# Patient Record
Sex: Female | Born: 1955 | Race: White | Hispanic: No | Marital: Married | Smoking: Never smoker
Health system: Southern US, Community
[De-identification: ages and names within clinical notes are randomized; demographics above are authoritative.]

## PROBLEM LIST (undated history)

## (undated) DIAGNOSIS — O24419 Gestational diabetes mellitus in pregnancy, unspecified control: Secondary | ICD-10-CM

## (undated) HISTORY — PX: LAPAROSCOPY: SHX197

## (undated) HISTORY — DX: Gestational diabetes mellitus in pregnancy, unspecified control: O24.419

---

## 2002-09-30 ENCOUNTER — Other Ambulatory Visit: Admission: RE | Admit: 2002-09-30 | Discharge: 2002-09-30 | Payer: Self-pay | Admitting: Family Medicine

## 2003-11-18 ENCOUNTER — Encounter: Payer: Self-pay | Admitting: Family Medicine

## 2003-11-18 ENCOUNTER — Other Ambulatory Visit: Admission: RE | Admit: 2003-11-18 | Discharge: 2003-11-18 | Payer: Self-pay | Admitting: Family Medicine

## 2003-11-18 LAB — CONVERTED CEMR LAB: Pap Smear: NORMAL

## 2006-12-15 ENCOUNTER — Encounter: Payer: Self-pay | Admitting: Family Medicine

## 2006-12-15 DIAGNOSIS — O99810 Abnormal glucose complicating pregnancy: Secondary | ICD-10-CM | POA: Insufficient documentation

## 2006-12-26 ENCOUNTER — Ambulatory Visit: Payer: Self-pay | Admitting: Family Medicine

## 2006-12-26 ENCOUNTER — Encounter: Payer: Self-pay | Admitting: Family Medicine

## 2006-12-26 ENCOUNTER — Other Ambulatory Visit: Admission: RE | Admit: 2006-12-26 | Discharge: 2006-12-26 | Payer: Self-pay | Admitting: Family Medicine

## 2006-12-29 LAB — CONVERTED CEMR LAB
ALT: 79 units/L — ABNORMAL HIGH (ref 0–35)
AST: 56 units/L — ABNORMAL HIGH (ref 0–37)
Albumin: 4.3 g/dL (ref 3.5–5.2)
Alkaline Phosphatase: 110 units/L (ref 39–117)
BUN: 13 mg/dL (ref 6–23)
Basophils Absolute: 0.1 10*3/uL (ref 0.0–0.1)
Basophils Relative: 1.6 % — ABNORMAL HIGH (ref 0.0–1.0)
CO2: 31 meq/L (ref 19–32)
Calcium: 9.5 mg/dL (ref 8.4–10.5)
Chloride: 105 meq/L (ref 96–112)
Creatinine, Ser: 0.6 mg/dL (ref 0.4–1.2)
HDL: 58.7 mg/dL (ref >39.0)
Hemoglobin: 13.9 g/dL (ref 12.0–15.0)
LDL Cholesterol: 117 mg/dL — ABNORMAL HIGH (ref 0–99)
MCHC: 35 g/dL (ref 30.0–36.0)
Monocytes Absolute: 0.4 10*3/uL (ref 0.2–0.7)
Monocytes Relative: 10.4 % (ref 3.0–11.0)
RBC: 4.25 M/uL (ref 3.87–5.11)
RDW: 12 % (ref 11.5–14.6)
Total Bilirubin: 1.1 mg/dL (ref 0.3–1.2)
Total CHOL/HDL Ratio: 3.2
Total Protein: 7.3 g/dL (ref 6.0–8.3)
VLDL: 10 mg/dL (ref 0–40)

## 2006-12-30 ENCOUNTER — Encounter (INDEPENDENT_AMBULATORY_CARE_PROVIDER_SITE_OTHER): Payer: Self-pay | Admitting: *Deleted

## 2006-12-30 LAB — CONVERTED CEMR LAB: Pap Smear: NORMAL

## 2006-12-31 ENCOUNTER — Encounter (INDEPENDENT_AMBULATORY_CARE_PROVIDER_SITE_OTHER): Payer: Self-pay | Admitting: *Deleted

## 2007-01-14 ENCOUNTER — Encounter: Payer: Self-pay | Admitting: Family Medicine

## 2007-01-19 ENCOUNTER — Encounter (INDEPENDENT_AMBULATORY_CARE_PROVIDER_SITE_OTHER): Payer: Self-pay | Admitting: *Deleted

## 2007-01-29 ENCOUNTER — Ambulatory Visit: Payer: Self-pay | Admitting: Family Medicine

## 2007-01-29 DIAGNOSIS — R740 Nonspecific elevation of levels of transaminase and lactic acid dehydrogenase [LDH]: Secondary | ICD-10-CM

## 2007-01-29 LAB — CONVERTED CEMR LAB
ALT: 42 units/L — ABNORMAL HIGH (ref 0–35)
AST: 35 units/L (ref 0–37)
Bilirubin, Direct: 0.1 mg/dL (ref 0.0–0.3)
Total Bilirubin: 0.9 mg/dL (ref 0.3–1.2)

## 2007-02-02 LAB — CONVERTED CEMR LAB
HCV Ab: NEGATIVE
Hepatitis B Surface Ag: NEGATIVE

## 2007-04-14 ENCOUNTER — Ambulatory Visit: Payer: Self-pay | Admitting: Family Medicine

## 2012-01-10 ENCOUNTER — Encounter: Payer: Self-pay | Admitting: Family Medicine

## 2012-01-10 ENCOUNTER — Other Ambulatory Visit (HOSPITAL_COMMUNITY)
Admission: RE | Admit: 2012-01-10 | Discharge: 2012-01-10 | Disposition: A | Discharge status: Home / Self Care | Payer: 59 | Source: Ambulatory Visit | Attending: Family Medicine | Admitting: Family Medicine

## 2012-01-10 ENCOUNTER — Ambulatory Visit (INDEPENDENT_AMBULATORY_CARE_PROVIDER_SITE_OTHER): Payer: 59 | Admitting: Family Medicine

## 2012-01-10 VITALS — BP 136/77 | HR 72 | Temp 98.5°F | Ht 66.0 in | Wt 122.8 lb

## 2012-01-10 DIAGNOSIS — Z01419 Encounter for gynecological examination (general) (routine) without abnormal findings: Secondary | ICD-10-CM | POA: Insufficient documentation

## 2012-01-10 DIAGNOSIS — Z1151 Encounter for screening for human papillomavirus (HPV): Secondary | ICD-10-CM | POA: Insufficient documentation

## 2012-01-10 DIAGNOSIS — Z1211 Encounter for screening for malignant neoplasm of colon: Secondary | ICD-10-CM | POA: Insufficient documentation

## 2012-01-10 DIAGNOSIS — Z23 Encounter for immunization: Secondary | ICD-10-CM

## 2012-01-10 DIAGNOSIS — Z1231 Encounter for screening mammogram for malignant neoplasm of breast: Secondary | ICD-10-CM

## 2012-01-10 DIAGNOSIS — Z0000 Encounter for general adult medical examination without abnormal findings: Secondary | ICD-10-CM | POA: Insufficient documentation

## 2012-01-10 NOTE — Assessment & Plan Note (Signed)
Not interested in colonosc yet Will give IFOB

## 2012-01-10 NOTE — Assessment & Plan Note (Signed)
Scheduled annual screening mammogram Nl breast exam today  Encouraged monthly self exams   

## 2012-01-10 NOTE — Patient Instructions (Addendum)
Labs today to send to [facility] Pap done  We will schedule mammogram at check out  Please do stool card for colon cancer screening  Make sure to get a flu shot in the fall

## 2012-01-10 NOTE — Progress Notes (Signed)
Subjective:    Patient ID: Annette Hess, female    DOB: 03-23-1956, 56 y.o.   MRN: [id number]  HPI  Here for health maintenance exam and to review chronic medical problems    Has been very busy   Wt is down 15 lb with bmi of 19 Was intentional - does not want to loose any more  Really happy with results  Stopped eating fried foods and late at night  Trying to get husband to eat better   Pap/ gyn care Has been over 3 years  Still has all her parts  Never had abn paps  Got through menopause ok   mammo- has been a long time  Self exam-- no lumps or changes   Colon cancer screen Never had colonoscopy  Would like to hold off until next year Will do ifob card   Tdap -needs update  Flu shot-got one last fall   Walks for exercise   Labs - will need at [facility]   Patient Active Problem List  Diagnosis  . GESTATIONAL DIABETES  . ELEVATION, TRANSAMINASE/LDH LEVELS  . Routine general medical examination at a health care facility   Past Medical History  Diagnosis Date  . Gestational diabetes    Past Surgical History  Procedure Date  . Laparoscopy     infertility   History  Substance Use Topics  . Smoking status: Never Smoker   . Smokeless tobacco: Not on file  . Alcohol Use: No   Family History  Problem Relation Age of Onset  . Heart defect Father     ablation  . Cancer Mother     liver (deceased)  . Diabetes Mother   . Cancer Maternal Grandmother     uterine  . Cancer Paternal Grandmother     breast   . Anorexia nervosa Daughter    No Known Allergies No current outpatient prescriptions on file prior to visit.      Review of Systems Review of Systems  Constitutional: Negative for fever, appetite change, fatigue and unexpected weight change.  Eyes: Negative for pain and visual disturbance.  Respiratory: Negative for cough and shortness of breath.   Cardiovascular: Negative for cp or palpitations    Gastrointestinal: Negative for nausea, diarrhea  and constipation.  Genitourinary: Negative for urgency and frequency.  Skin: Negative for pallor or rash   Neurological: Negative for weakness, light-headedness, numbness and headaches.  Hematological: Negative for adenopathy. Does not bruise/bleed easily.  Psychiatric/Behavioral: Negative for dysphoric mood. The patient is not nervous/anxious.         Objective:   Physical Exam  Constitutional: She appears well-developed and well-nourished. No distress.  HENT:  Head: Normocephalic and atraumatic.  Right Ear: External ear normal.  Left Ear: External ear normal.  Nose: Nose normal.  Mouth/Throat: Oropharynx is clear and moist.  Eyes: Conjunctivae and EOM are normal. Pupils are equal, round, and reactive to light. No scleral icterus.  Neck: Normal range of motion. Neck supple. No JVD present. Carotid bruit is not present. No thyromegaly present.  Cardiovascular: Normal rate, regular rhythm, normal heart sounds and intact distal pulses.  Exam reveals no gallop.   Pulmonary/Chest: Effort normal and breath sounds normal. No respiratory distress. She has no wheezes.  Abdominal: Soft. Bowel sounds are normal. She exhibits no distension, no abdominal bruit and no mass. There is no tenderness.  Genitourinary: Vagina normal and uterus normal. No breast swelling, tenderness, discharge or bleeding. There is no rash, tenderness or lesion on  the right labia. There is no rash, tenderness or lesion on the left labia. Uterus is not enlarged and not tender. Cervix exhibits no motion tenderness, no discharge and no friability. Right adnexum displays no mass, no tenderness and no fullness. Left adnexum displays no mass, no tenderness and no fullness. No bleeding around the vagina. No vaginal discharge found.       Breast exam: No mass, nodules, thickening, tenderness, bulging, retraction, inflamation, nipple discharge or skin changes noted.  No axillary or clavicular LA.  Chaperoned exam.    Musculoskeletal:  Normal range of motion. She exhibits no edema and no tenderness.  Lymphadenopathy:    She has no cervical adenopathy.  Neurological: She is alert. She has normal reflexes. No cranial nerve deficit. She exhibits normal muscle tone. Coordination normal.  Skin: Skin is warm and dry. No rash noted. No erythema. No pallor.  Psychiatric: She has a normal mood and affect.          Assessment & Plan:

## 2012-01-10 NOTE — Assessment & Plan Note (Signed)
Reviewed health habits including diet and exercise and skin cancer prevention Also reviewed health mt list, fam hx and immunizations   Lab today to send to [facility]

## 2012-01-10 NOTE — Assessment & Plan Note (Signed)
Annual exam Post menopausal Disc need for ca and D Pap done

## 2012-01-11 LAB — COMPREHENSIVE METABOLIC PANEL
ALT: 18 IU/L (ref 0–32)
AST: 22 IU/L (ref 0–40)
Alkaline Phosphatase: 102 IU/L (ref 42–107)
BUN/Creatinine Ratio: 14 (ref 9–23)
CO2: 18 mmol/L — ABNORMAL LOW (ref 19–28)
Chloride: 104 mmol/L (ref 97–108)
GFR calc Af Amer: 117 mL/min/{1.73_m2} (ref >59)
Potassium: 4 mmol/L (ref 3.5–5.2)
Total Bilirubin: 0.4 mg/dL (ref 0.0–1.2)

## 2012-01-11 LAB — CBC WITH DIFFERENTIAL/PLATELET
Basophils Absolute: 0.1 10*3/uL (ref 0.0–0.2)
Eosinophils Absolute: 0.2 10*3/uL (ref 0.0–0.4)
HCT: 42.8 % (ref 34.0–46.6)
Hemoglobin: 14.1 g/dL (ref 11.1–15.9)
Immature Granulocytes: 0 % (ref 0–2)
Lymphocytes Absolute: 1.1 10*3/uL (ref 0.7–4.5)
Lymphs: 30 % (ref 14–46)
MCH: 31.2 pg (ref 26.6–33.0)
MCHC: 32.9 g/dL (ref 31.5–35.7)
Neutrophils Absolute: 2 10*3/uL (ref 1.8–7.8)
RDW: 13.1 % (ref 12.3–15.4)

## 2012-01-11 LAB — LIPID PANEL: VLDL Cholesterol Cal: 16 mg/dL (ref 5–40)

## 2012-01-11 LAB — TSH: TSH: 0.952 u[IU]/mL (ref 0.450–4.500)

## 2012-02-13 ENCOUNTER — Ambulatory Visit: Payer: Self-pay | Admitting: Family Medicine

## 2012-02-17 ENCOUNTER — Encounter: Payer: Self-pay | Admitting: Family Medicine

## 2012-02-18 ENCOUNTER — Encounter: Payer: Self-pay | Admitting: *Deleted

## 2012-02-18 ENCOUNTER — Other Ambulatory Visit: Payer: 59

## 2012-02-18 DIAGNOSIS — Z1211 Encounter for screening for malignant neoplasm of colon: Secondary | ICD-10-CM

## 2012-02-18 LAB — FECAL OCCULT BLOOD, IMMUNOCHEMICAL: Fecal Occult Bld: NEGATIVE

## 2012-02-19 ENCOUNTER — Encounter: Payer: Self-pay | Admitting: *Deleted

## 2013-04-06 ENCOUNTER — Encounter: Payer: Self-pay | Admitting: Family Medicine

## 2013-04-06 ENCOUNTER — Ambulatory Visit (INDEPENDENT_AMBULATORY_CARE_PROVIDER_SITE_OTHER): Payer: 59 | Admitting: Family Medicine

## 2013-04-06 VITALS — BP 128/82 | HR 73 | Temp 97.6°F | Ht 66.0 in | Wt 134.0 lb

## 2013-04-06 DIAGNOSIS — R5381 Other malaise: Secondary | ICD-10-CM | POA: Insufficient documentation

## 2013-04-06 DIAGNOSIS — F430 Acute stress reaction: Secondary | ICD-10-CM | POA: Insufficient documentation

## 2013-04-06 NOTE — Patient Instructions (Signed)
We will do a counseling referral at check out  Do what you can to take care of yourself  Still investigate couples counseling  Labs today

## 2013-04-06 NOTE — Progress Notes (Signed)
Subjective:    Patient ID: Annette Hess, female    DOB: 11-07-1955, 57 y.o.   MRN: [id number]  HPI Here for fatigue  Much stress Is tearful  Overwhelmed with stressors and responsibilities   Works 2 full time jobs -"that cannot change"- for 5 years  Husband got laid off 6 years ago and just starting back to work  Has a child to anorexia - finally recovered and moved out -that is good One of her twins had to move back in after difficulty with spouse MIL is dying with liver cancer -now has dementia  FIL- cannot hear - difficult care situation   Husband is planning his retirement in 4 years  He takes care of the house - and trying to downsize  She will probably go see a counselor   Has a friend who is good support to her   When she is stressed - she wants to be left alone - she is an introvert   She wants to know that nothing is wrong physically   Patient Active Problem List   Diagnosis Date Noted  . Routine general medical examination at a health care facility 01/10/2012  . Routine gynecological examination 01/10/2012  . Colon cancer screening 01/10/2012  . Other screening mammogram 01/10/2012  . ELEVATION, TRANSAMINASE/LDH LEVELS 01/29/2007  . GESTATIONAL DIABETES 12/15/2006   Past Medical History  Diagnosis Date  . Gestational diabetes    Past Surgical History  Procedure Laterality Date  . Laparoscopy      infertility   History  Substance Use Topics  . Smoking status: Never Smoker   . Smokeless tobacco: Not on file  . Alcohol Use: No   Family History  Problem Relation Age of Onset  . Heart defect Father     ablation  . Cancer Mother     liver (deceased)  . Diabetes Mother   . Cancer Maternal Grandmother     uterine  . Cancer Paternal Grandmother     breast   . Anorexia nervosa Daughter    No Known Allergies No current outpatient prescriptions on file prior to visit.   No current facility-administered medications on file prior to visit.      Review of Systems     Objective:   Physical Exam  Constitutional: She appears well-developed and well-nourished. No distress.  HENT:  Head: Normocephalic and atraumatic.  Right Ear: External ear normal.  Left Ear: External ear normal.  Nose: Nose normal.  Mouth/Throat: Oropharynx is clear and moist.  Eyes: Conjunctivae and EOM are normal. Pupils are equal, round, and reactive to light. Right eye exhibits no discharge. Left eye exhibits no discharge. No scleral icterus.  Neck: Normal range of motion. Neck supple. No JVD present. No thyromegaly present.  Cardiovascular: Normal rate, regular rhythm, normal heart sounds and intact distal pulses.  Exam reveals no gallop.   Pulmonary/Chest: Effort normal and breath sounds normal. No respiratory distress. She has no wheezes. She has no rales.  Abdominal: Soft. Bowel sounds are normal. She exhibits no distension and no mass. There is no tenderness.  Musculoskeletal: She exhibits no edema and no tenderness.  Lymphadenopathy:    She has no cervical adenopathy.  Neurological: She is alert. She has normal reflexes. No cranial nerve deficit. She exhibits normal muscle tone. Coordination normal.  Skin: Skin is warm and dry. No rash noted. No erythema. No pallor.  Psychiatric: Her speech is normal and behavior is normal. Her mood appears anxious. Thought content is not  paranoid. Cognition and memory are normal. She exhibits a depressed mood. She expresses no homicidal and no suicidal ideation.  Tearful  Is able to discuss stressors          Assessment & Plan:

## 2013-04-06 NOTE — Progress Notes (Signed)
Pre-visit discussion using our clinic review tool. No additional management support is needed unless otherwise documented below in the visit note.  

## 2013-04-07 NOTE — Assessment & Plan Note (Signed)
Suspect from work schedule/ 2 jobs and stressors with poor sleep  However- lab today to r/o other organic cause

## 2013-04-07 NOTE — Assessment & Plan Note (Addendum)
Social stressors are severe - eliminating time for self care in any way  Also marital issues Disc need for change in schedule Reviewed stressors/ coping techniques/symptoms/ support sources/ tx options and side effects in detail today  >25 min spent with face to face with patient, >50% counseling and/or coordinating care Ref to counseling  Also plans on marital counseling

## 2013-04-08 LAB — TSH: TSH: 1.23 u[IU]/mL (ref 0.450–4.500)

## 2013-04-08 LAB — CBC WITH DIFFERENTIAL/PLATELET
Basophils Absolute: 0.1 10*3/uL (ref 0.0–0.2)
Eos: 6 %
HCT: 43.7 % (ref 34.0–46.6)
Hemoglobin: 14.8 g/dL (ref 11.1–15.9)
Immature Grans (Abs): 0 10*3/uL (ref 0.0–0.1)
Immature Granulocytes: 0 %
Lymphocytes Absolute: 1.2 10*3/uL (ref 0.7–3.1)
Lymphs: 32 %
MCHC: 33.9 g/dL (ref 31.5–35.7)
Monocytes: 8 %
Neutrophils Absolute: 1.9 10*3/uL (ref 1.4–7.0)
Neutrophils Relative %: 51 %
RBC: 4.67 x10E6/uL (ref 3.77–5.28)
WBC: 3.7 10*3/uL (ref 3.4–10.8)

## 2013-04-08 LAB — COMPREHENSIVE METABOLIC PANEL
ALT: 23 IU/L (ref 0–32)
AST: 22 IU/L (ref 0–40)
Albumin/Globulin Ratio: 3.3 — ABNORMAL HIGH (ref 1.1–2.5)
BUN: 10 mg/dL (ref 6–24)
CO2: 28 mmol/L (ref 18–29)
Calcium: 9.2 mg/dL (ref 8.7–10.2)
Chloride: 103 mmol/L (ref 97–108)
GFR calc Af Amer: 106 mL/min/{1.73_m2} (ref >59)
Glucose: 85 mg/dL (ref 65–99)
Potassium: 5.1 mmol/L (ref 3.5–5.2)
Sodium: 144 mmol/L (ref 134–144)
Total Bilirubin: 0.4 mg/dL (ref 0.0–1.2)
Total Protein: 6.8 g/dL (ref 6.0–8.5)

## 2013-04-08 LAB — VITAMIN B12: Vitamin B-12: 1057 pg/mL — ABNORMAL HIGH (ref 211–946)

## 2013-04-15 ENCOUNTER — Ambulatory Visit (INDEPENDENT_AMBULATORY_CARE_PROVIDER_SITE_OTHER): Payer: 59 | Admitting: Psychology

## 2013-04-15 DIAGNOSIS — F4322 Adjustment disorder with anxiety: Secondary | ICD-10-CM

## 2013-05-06 ENCOUNTER — Ambulatory Visit (INDEPENDENT_AMBULATORY_CARE_PROVIDER_SITE_OTHER): Payer: 59 | Admitting: Psychology

## 2013-05-06 DIAGNOSIS — F4322 Adjustment disorder with anxiety: Secondary | ICD-10-CM

## 2013-05-12 ENCOUNTER — Encounter: Payer: Self-pay | Admitting: Family Medicine

## 2013-05-13 ENCOUNTER — Ambulatory Visit (INDEPENDENT_AMBULATORY_CARE_PROVIDER_SITE_OTHER): Payer: 59 | Admitting: Psychology

## 2013-05-13 DIAGNOSIS — F4322 Adjustment disorder with anxiety: Secondary | ICD-10-CM

## 2013-06-03 ENCOUNTER — Ambulatory Visit (INDEPENDENT_AMBULATORY_CARE_PROVIDER_SITE_OTHER): Payer: 59 | Admitting: Psychology

## 2013-06-03 DIAGNOSIS — F4322 Adjustment disorder with anxiety: Secondary | ICD-10-CM

## 2013-07-01 ENCOUNTER — Ambulatory Visit (INDEPENDENT_AMBULATORY_CARE_PROVIDER_SITE_OTHER): Payer: 59 | Admitting: Psychology

## 2013-07-01 DIAGNOSIS — F4322 Adjustment disorder with anxiety: Secondary | ICD-10-CM

## 2014-03-15 ENCOUNTER — Ambulatory Visit: Payer: Self-pay | Admitting: Family Medicine

## 2014-03-16 ENCOUNTER — Encounter: Payer: Self-pay | Admitting: Family Medicine

## 2015-02-02 ENCOUNTER — Telehealth: Payer: Self-pay | Admitting: Family Medicine

## 2015-02-02 DIAGNOSIS — Z0000 Encounter for general adult medical examination without abnormal findings: Secondary | ICD-10-CM

## 2015-02-02 NOTE — Telephone Encounter (Signed)
-----   Message from Baldomero Lamy sent at 01/27/2015  2:15 PM EDT ----- Regarding: Cpx labs Fri 9/9 need orders. Thanks! :-) Please order  future cpx labs for pt's upcoming lab appt. Thanks Rodney Booze

## 2015-02-03 ENCOUNTER — Other Ambulatory Visit (INDEPENDENT_AMBULATORY_CARE_PROVIDER_SITE_OTHER): Payer: 59

## 2015-02-03 DIAGNOSIS — Z0000 Encounter for general adult medical examination without abnormal findings: Secondary | ICD-10-CM

## 2015-02-03 NOTE — Addendum Note (Signed)
Addended by: Alvina Chou on: 02/03/2015 08:21 AM   Modules accepted: Orders

## 2015-02-04 LAB — CBC WITH DIFFERENTIAL/PLATELET
Basophils Absolute: 0.1 10*3/uL (ref 0.0–0.2)
Basos: 1 %
EOS (ABSOLUTE): 0.3 10*3/uL (ref 0.0–0.4)
Eos: 5 %
Hematocrit: 43.6 % (ref 34.0–46.6)
Hemoglobin: 14.3 g/dL (ref 11.1–15.9)
IMMATURE GRANULOCYTES: 0 %
Immature Grans (Abs): 0 10*3/uL (ref 0.0–0.1)
LYMPHS ABS: 1.7 10*3/uL (ref 0.7–3.1)
Lymphs: 36 %
MCH: 30.7 pg (ref 26.6–33.0)
MCHC: 32.8 g/dL (ref 31.5–35.7)
MCV: 94 fL (ref 79–97)
MONOS ABS: 0.4 10*3/uL (ref 0.1–0.9)
Monocytes: 9 %
NEUTROS PCT: 49 %
Neutrophils Absolute: 2.4 10*3/uL (ref 1.4–7.0)
PLATELETS: 346 10*3/uL (ref 150–379)
RBC: 4.66 x10E6/uL (ref 3.77–5.28)
RDW: 13 % (ref 12.3–15.4)
WBC: 4.8 10*3/uL (ref 3.4–10.8)

## 2015-02-04 LAB — LIPID PANEL
CHOLESTEROL TOTAL: 208 mg/dL — AB (ref 100–199)
Chol/HDL Ratio: 2.8 ratio units (ref 0.0–4.4)
HDL: 73 mg/dL (ref >39)
LDL CALC: 118 mg/dL — AB (ref 0–99)
TRIGLYCERIDES: 87 mg/dL (ref 0–149)
VLDL CHOLESTEROL CAL: 17 mg/dL (ref 5–40)

## 2015-02-04 LAB — COMPREHENSIVE METABOLIC PANEL
A/G RATIO: 1.4 (ref 1.1–2.5)
ALK PHOS: 124 IU/L — AB (ref 39–117)
ALT: 19 IU/L (ref 0–32)
AST: 18 IU/L (ref 0–40)
Albumin: 4 g/dL (ref 3.5–5.5)
BILIRUBIN TOTAL: 0.2 mg/dL (ref 0.0–1.2)
BUN/Creatinine Ratio: 21 (ref 9–23)
BUN: 12 mg/dL (ref 6–24)
CO2: 25 mmol/L (ref 18–29)
Calcium: 9.2 mg/dL (ref 8.7–10.2)
Chloride: 103 mmol/L (ref 97–108)
Creatinine, Ser: 0.57 mg/dL (ref 0.57–1.00)
GFR calc Af Amer: 118 mL/min/{1.73_m2} (ref >59)
GFR calc non Af Amer: 103 mL/min/{1.73_m2} (ref >59)
GLOBULIN, TOTAL: 2.9 g/dL (ref 1.5–4.5)
Glucose: 87 mg/dL (ref 65–99)
POTASSIUM: 5.6 mmol/L — AB (ref 3.5–5.2)
SODIUM: 145 mmol/L — AB (ref 134–144)
Total Protein: 6.9 g/dL (ref 6.0–8.5)

## 2015-02-04 LAB — HEMOGLOBIN A1C
ESTIMATED AVERAGE GLUCOSE: 114 mg/dL
HEMOGLOBIN A1C: 5.6 % (ref 4.8–5.6)

## 2015-02-04 LAB — TSH: TSH: 2.37 u[IU]/mL (ref 0.450–4.500)

## 2015-02-08 ENCOUNTER — Encounter: Payer: Self-pay | Admitting: Family Medicine

## 2015-02-08 ENCOUNTER — Ambulatory Visit (INDEPENDENT_AMBULATORY_CARE_PROVIDER_SITE_OTHER): Payer: 59 | Admitting: Family Medicine

## 2015-02-08 VITALS — BP 126/84 | HR 79 | Temp 99.1°F | Ht 66.5 in | Wt 144.0 lb

## 2015-02-08 DIAGNOSIS — Z1211 Encounter for screening for malignant neoplasm of colon: Secondary | ICD-10-CM | POA: Diagnosis not present

## 2015-02-08 DIAGNOSIS — Z0000 Encounter for general adult medical examination without abnormal findings: Secondary | ICD-10-CM

## 2015-02-08 DIAGNOSIS — E875 Hyperkalemia: Secondary | ICD-10-CM | POA: Diagnosis not present

## 2015-02-08 DIAGNOSIS — R748 Abnormal levels of other serum enzymes: Secondary | ICD-10-CM | POA: Insufficient documentation

## 2015-02-08 NOTE — Progress Notes (Signed)
Pre visit review using our clinic review tool, if applicable. No additional management support is needed unless otherwise documented below in the visit note. 

## 2015-02-08 NOTE — Patient Instructions (Signed)
Call after the first of the year if you want to get started with a colonoscopy referral Take care of yourself Get your flu shot at work as planned Get your mammogram as planned  Lab today to re check potassium and alk phos

## 2015-02-08 NOTE — Progress Notes (Signed)
Subjective:    Patient ID: Annette Hess, female    DOB: October 27, 1955, 59 y.o.   MRN: [id number]  HPI Here for health maintenance exam and to review chronic medical problems    Has been feeling ok   Wt is up 10 lb with bmi of 22  HIV screen- not high risk , declines  Hep C screen done and neg   Colon cancer screen-not ready for colonoscopy yet - would like to call after the first of the year    Flu shot- will get at work   Pap 8/13 nl  Has not had abn paps in the past  Declines pap- she wants to do it next time  No symptoms  No new partners   Mammogram 10/15 nl - does them at work now -and is already on the list /scheduled  Self exam-no lumps   Td 8/13   Cholesterol Lab Results  Component Value Date   CHOL 208* 02/03/2015   CHOL 209* 01/10/2012   CHOL 186 12/26/2006   Lab Results  Component Value Date   HDL 73 02/03/2015   HDL 82 01/10/2012   HDL 58.7 12/26/2006   Lab Results  Component Value Date   LDLCALC 118* 02/03/2015   LDLCALC 111* 01/10/2012   LDLCALC 117* 12/26/2006   Lab Results  Component Value Date   TRIG 87 02/03/2015   TRIG 78 01/10/2012   TRIG 51 12/26/2006   Lab Results  Component Value Date   CHOLHDL 2.8 02/03/2015   CHOLHDL 2.5 01/10/2012   CHOLHDL 3.2 CALC 12/26/2006   No results found for: LDLDIRECT     Chemistry      Component Value Date/Time   NA 145* 02/03/2015 0819   NA 142 12/26/2006 1006   K 5.6* 02/03/2015 0819   CL 103 02/03/2015 0819   CO2 25 02/03/2015 0819   BUN 12 02/03/2015 0819   BUN 13 12/26/2006 1006   CREATININE 0.57 02/03/2015 0819      Component Value Date/Time   CALCIUM 9.2 02/03/2015 0819   ALKPHOS 124* 02/03/2015 0819   AST 18 02/03/2015 0819   ALT 19 02/03/2015 0819   BILITOT 0.2 02/03/2015 0819   BILITOT 0.4 04/06/2013 1107     needs to drink more water  No tylenol  Wine one glass every 2-3 weeks   Lab Results  Component Value Date   WBC 4.8 02/03/2015   HGB 14.8 04/06/2013   HCT  43.6 02/03/2015   MCV 94 04/06/2013   PLT 320 12/26/2006    Lab Results  Component Value Date   TSH 2.370 02/03/2015    Patient Active Problem List   Diagnosis Date Noted  . Other malaise and fatigue 04/06/2013  . Stress reaction 04/06/2013  . Routine general medical examination at a health care facility 01/10/2012  . Routine gynecological examination 01/10/2012  . Colon cancer screening 01/10/2012  . Other screening mammogram 01/10/2012  . ELEVATION, TRANSAMINASE/LDH LEVELS 01/29/2007  . GESTATIONAL DIABETES 12/15/2006   Past Medical History  Diagnosis Date  . Gestational diabetes    Past Surgical History  Procedure Laterality Date  . Laparoscopy      infertility   Social History  Substance Use Topics  . Smoking status: Never Smoker   . Smokeless tobacco: None  . Alcohol Use: No   Family History  Problem Relation Age of Onset  . Heart defect Father     ablation  . Cancer Mother     liver (  deceased)  . Diabetes Mother   . Cancer Maternal Grandmother     uterine  . Cancer Paternal Grandmother     breast   . Anorexia nervosa Daughter    No Known Allergies No current outpatient prescriptions on file prior to visit.   No current facility-administered medications on file prior to visit.    Review of Systems Review of Systems  Constitutional: Negative for fever, appetite change, fatigue and unexpected weight change.  Eyes: Negative for pain and visual disturbance.  Respiratory: Negative for cough and shortness of breath.   Cardiovascular: Negative for cp or palpitations    Gastrointestinal: Negative for nausea, diarrhea and constipation.  Genitourinary: Negative for urgency and frequency.  Skin: Negative for pallor or rash   Neurological: Negative for weakness, light-headedness, numbness and headaches.  Hematological: Negative for adenopathy. Does not bruise/bleed easily.  Psychiatric/Behavioral: Negative for dysphoric mood. The patient is not nervous/anxious.          Objective:   Physical Exam  Constitutional: She appears well-developed and well-nourished. No distress.  Well appearing   HENT:  Head: Normocephalic and atraumatic.  Right Ear: External ear normal.  Left Ear: External ear normal.  Mouth/Throat: Oropharynx is clear and moist.  Eyes: Conjunctivae and EOM are normal. Pupils are equal, round, and reactive to light. No scleral icterus.  Neck: Normal range of motion. Neck supple. No JVD present. Carotid bruit is not present. No thyromegaly present.  Cardiovascular: Normal rate, regular rhythm, normal heart sounds and intact distal pulses.  Exam reveals no gallop.   Pulmonary/Chest: Effort normal and breath sounds normal. No respiratory distress. She has no wheezes. She exhibits no tenderness.  Abdominal: Soft. Bowel sounds are normal. She exhibits no distension, no abdominal bruit and no mass. There is no tenderness.  Genitourinary: No breast swelling, tenderness, discharge or bleeding.  Breast exam: No mass, nodules, thickening, tenderness, bulging, retraction, inflamation, nipple discharge or skin changes noted.  No axillary or clavicular LA.      Musculoskeletal: Normal range of motion. She exhibits no edema or tenderness.  Lymphadenopathy:    She has no cervical adenopathy.  Neurological: She is alert. She has normal reflexes. No cranial nerve deficit. She exhibits normal muscle tone. Coordination normal.  Skin: Skin is warm and dry. No rash noted. No erythema. No pallor.  Psychiatric: She has a normal mood and affect.          Assessment & Plan:   Problem List Items Addressed This Visit      Other   Alkaline phosphatase raised    Mildly elevated- will recheck with isoenzymes       Relevant Orders   Alkaline Phosphatase, Isoenzymes   Colon cancer screening    Ref for colonoscopy after the first of the year-pt will call to do that       Hyperkalemia    Suspect specimen was hemolyzed - will re check today       Relevant Orders   Basic metabolic panel   Routine general medical examination at a health care facility - Primary    Reviewed health habits including diet and exercise and skin cancer prevention Reviewed appropriate screening tests for age  Also reviewed health mt list, fam hx and immunization status , as well as social and family history   See HPI Labs reviewed Call after the first of the year if you want to get started with a colonoscopy referral Take care of yourself Get your flu shot at work  as planned Get your mammogram as planned  Lab today to re check potassium and alk phos

## 2015-02-09 NOTE — Assessment & Plan Note (Signed)
Mildly elevated- will recheck with isoenzymes

## 2015-02-09 NOTE — Assessment & Plan Note (Signed)
Ref for colonoscopy after the first of the year-pt will call to do that

## 2015-02-09 NOTE — Assessment & Plan Note (Signed)
Reviewed health habits including diet and exercise and skin cancer prevention Reviewed appropriate screening tests for age  Also reviewed health mt list, fam hx and immunization status , as well as social and family history   See HPI Labs reviewed Call after the first of the year if you want to get started with a colonoscopy referral Take care of yourself Get your flu shot at work as planned Get your mammogram as planned  Lab today to re check potassium and alk phos

## 2015-02-09 NOTE — Assessment & Plan Note (Signed)
Suspect specimen was hemolyzed - will re check today

## 2015-02-10 LAB — BASIC METABOLIC PANEL
BUN / CREAT RATIO: 25 — AB (ref 9–23)
BUN: 17 mg/dL (ref 6–24)
CALCIUM: 9.4 mg/dL (ref 8.7–10.2)
CHLORIDE: 103 mmol/L (ref 97–108)
CO2: 23 mmol/L (ref 18–29)
CREATININE: 0.69 mg/dL (ref 0.57–1.00)
GFR calc non Af Amer: 96 mL/min/{1.73_m2} (ref >59)
GFR, EST AFRICAN AMERICAN: 111 mL/min/{1.73_m2} (ref >59)
Glucose: 90 mg/dL (ref 65–99)
Potassium: 4.9 mmol/L (ref 3.5–5.2)
Sodium: 142 mmol/L (ref 134–144)

## 2015-02-10 LAB — ALKALINE PHOSPHATASE, ISOENZYMES
ALK PHOS: 123 IU/L — AB (ref 39–117)
BONE FRACTION: 53 % (ref 14–68)
INTESTINAL FRAC.: 15 % (ref 0–18)
LIVER FRACTION: 32 % (ref 18–85)

## 2015-03-23 ENCOUNTER — Other Ambulatory Visit: Payer: Self-pay | Admitting: Family Medicine

## 2015-03-23 DIAGNOSIS — Z1231 Encounter for screening mammogram for malignant neoplasm of breast: Secondary | ICD-10-CM

## 2015-03-28 ENCOUNTER — Ambulatory Visit
Admission: RE | Admit: 2015-03-28 | Discharge: 2015-03-28 | Disposition: A | Discharge status: Home / Self Care | Payer: 59 | Source: Ambulatory Visit | Attending: Family Medicine | Admitting: Family Medicine

## 2015-03-28 DIAGNOSIS — Z1231 Encounter for screening mammogram for malignant neoplasm of breast: Secondary | ICD-10-CM | POA: Diagnosis present

## 2015-08-25 ENCOUNTER — Telehealth: Payer: Self-pay | Admitting: *Deleted

## 2015-08-25 MED ORDER — OSELTAMIVIR PHOSPHATE 75 MG PO CAPS: 75.0000 mg | ORAL_CAPSULE | Freq: Every day | ORAL | Status: DC

## 2015-08-25 NOTE — Telephone Encounter (Signed)
Pt left vm on triage and states that she has been exposed to the flu at her employer. She states she received a flu shot this season, but this particular type was not covered in the immunization. Pt states that she is going out of town for her daughters wedding on Tuesday, and is afraid she will catch it and be unable to attend the wedding. Pt last seen 01/2015-CPE. Pt is requesting a call back before the end of day with advice and/or resolution

## 2015-08-25 NOTE — Telephone Encounter (Signed)
Pt notified Rx sent and pt notified

## 2015-08-25 NOTE — Telephone Encounter (Signed)
Please send in tamiflu for prophylaxis  No guarantee it will work but it may be worth it Please call it in

## 2015-10-10 ENCOUNTER — Ambulatory Visit (INDEPENDENT_AMBULATORY_CARE_PROVIDER_SITE_OTHER): Payer: 59 | Admitting: Internal Medicine

## 2015-10-10 ENCOUNTER — Telehealth: Payer: Self-pay | Admitting: Family Medicine

## 2015-10-10 ENCOUNTER — Encounter: Payer: Self-pay | Admitting: Internal Medicine

## 2015-10-10 VITALS — BP 118/78 | HR 76 | Temp 97.9°F | Wt 161.5 lb

## 2015-10-10 DIAGNOSIS — R600 Localized edema: Secondary | ICD-10-CM | POA: Diagnosis not present

## 2015-10-10 MED ORDER — FUROSEMIDE 20 MG PO TABS: 20.0000 mg | ORAL_TABLET | Freq: Every day | ORAL | Status: DC

## 2015-10-10 NOTE — Telephone Encounter (Signed)
Pt has appt 10/10/15 at 3 PM with Pamala Hurry Baity NP.

## 2015-10-10 NOTE — Progress Notes (Signed)
Pre visit review using our clinic review tool, if applicable. No additional management support is needed unless otherwise documented below in the visit note. 

## 2015-10-10 NOTE — Telephone Encounter (Signed)
Patient Name: Annette Hess DOB: 01/20/1956 Initial Comment Caller states c/o left leg swelling, pain, warm to the touch Nurse Assessment Nurse: Roma KayserForsythe, RN, Santina Evansatherine Date/Time (Eastern Time): 10/10/2015 12:07:52 PM Confirm and document reason for call. If symptomatic, describe symptoms. You must click the next button to save text entered. ---caller states since Thursday pain and swelling and slight warmth in leg in front of shin only on left side. no known injury, hurts when i walk 3/10 Has the patient traveled out of the country within the last 30 days? ---Not Applicable Does the patient have any new or worsening symptoms? ---Yes Will a triage be completed? ---Yes Related visit to physician within the last 2 weeks? ---No Does the PT have any chronic conditions? (i.e. diabetes, asthma, etc.) ---Unknown Is this a behavioral health or substance abuse call? ---No Guidelines Guideline Title Affirmed Question Affirmed Notes Leg Pain [1] Thigh, calf, or ankle swelling AND [2] only 1 side Final Disposition User See Physician within 4 Hours (or PCP triage) Roma KayserForsythe, RN, Santina Evansatherine Referrals REFERRED TO PCP OFFICE Disagree/Comply: Danella Maiersomply

## 2015-10-10 NOTE — Progress Notes (Signed)
Subjective:    Patient ID: Annette RobertsLaurel M Hess, female    DOB: 10/05/1955, 60 y.o.   MRN: [id number]  HPI  Pt presents to the clinic today with c/o left leg pain and swelling. She noticed this 5 days ago. She describes the pain as achy. It seems worse with walking and going up/down stairs. She denies difficulty with her gait. She does stand on concrete floors all day for work. She does not smoke. She denies recent travel. She denies any injury to the area.  Review of Systems      Past Medical History  Diagnosis Date  . Gestational diabetes     No current outpatient prescriptions on file.   No current facility-administered medications for this visit.    No Known Allergies  Family History  Problem Relation Age of Onset  . Heart defect Father     ablation  . Cancer Mother     liver (deceased)  . Diabetes Mother   . Cancer Maternal Grandmother     uterine  . Cancer Paternal Grandmother     breast   . Breast cancer Paternal Grandmother [phone]  . Anorexia nervosa Daughter     Social History   Social History  . Marital Status: Married    Spouse Name: N/A  . Number of Children: N/A  . Years of Education: N/A   Occupational History  . Not on file.   Social History Main Topics  . Smoking status: Never Smoker   . Smokeless tobacco: Not on file  . Alcohol Use: No  . Drug Use: No  . Sexual Activity: Not on file   Other Topics Concern  . Not on file   Social History Narrative   Married 3 children, non smoker, no alcohol.     Constitutional: Denies fever, malaise, fatigue, headache or abrupt weight changes.  Respiratory: Denies difficulty breathing, shortness of breath, cough or sputum production.   Cardiovascular: Denies chest pain, chest tightness, palpitations or swelling in the hands or feet.  Musculoskeletal: Pt reports left leg pain and swelling. Denies decrease in range of motion, muscle paing.  Skin: Denies redness, rashes, lesions or ulcercations.    No  other specific complaints in a complete review of systems (except as listed in HPI above).  Objective:   Physical Exam   BP 118/78 mmHg  Pulse 76  Temp(Src) 97.9 F (36.6 C) (Oral)  Wt 161 lb 8 oz (73.256 kg)  SpO2 98% Wt Readings from Last 3 Encounters:  10/10/15 161 lb 8 oz (73.256 kg)  02/08/15 144 lb (65.318 kg)  04/06/13 134 lb (60.782 kg)    General: Appears her stated age, well developed, well nourished in NAD. Skin: Warm, dry and intact. No redness over the left lower leg, mild warmth. Cardiovascular: Normal rate and rhythm. S1,S2 noted.  No murmur, rubs or gallops noted. Negative Homan's sign on the left. Pulmonary/Chest: Normal effort and positive vesicular breath sounds. No respiratory distress. No wheezes, rales or ronchi noted.  Musculoskeletal: Normal flexion, extension and rotation of the left ankle. Mild 1+ non pitting pre tibial edema, stops just above the ankle. No difficulty with gait.   BMET    Component Value Date/Time   NA 142 02/08/2015 1554   NA 142 12/26/2006 1006   K 4.9 02/08/2015 1554   CL 103 02/08/2015 1554   CO2 23 02/08/2015 1554   GLUCOSE 90 02/08/2015 1554   GLUCOSE 76 12/26/2006 1006   BUN 17 02/08/2015 1554  BUN 13 12/26/2006 1006   CREATININE 0.69 02/08/2015 1554   CALCIUM 9.4 02/08/2015 1554   GFRNONAA 96 02/08/2015 1554   GFRAA 111 02/08/2015 1554    Lipid Panel     Component Value Date/Time   CHOL 208* 02/03/2015 0819   CHOL 186 12/26/2006 1006   TRIG 87 02/03/2015 0819   HDL 73 02/03/2015 0819   HDL 58.7 12/26/2006 1006   CHOLHDL 2.8 02/03/2015 0819   CHOLHDL 3.2 CALC 12/26/2006 1006   VLDL 10 12/26/2006 1006   LDLCALC 118* 02/03/2015 0819   LDLCALC 117* 12/26/2006 1006    CBC    Component Value Date/Time   WBC 4.8 02/03/2015 0819   WBC 3.7* 12/26/2006 1006   RBC 4.66 02/03/2015 0819   RBC 4.25 12/26/2006 1006   HGB 14.8 04/06/2013 1107   HCT 43.6 02/03/2015 0819   HCT 43.7 04/06/2013 1107   PLT 346  02/03/2015 0819   PLT 320 12/26/2006 1006   MCV 94 02/03/2015 0819   MCV 94 04/06/2013 1107   MCH 30.7 02/03/2015 0819   MCHC 32.8 02/03/2015 0819   MCHC 35.0 12/26/2006 1006   RDW 13.0 02/03/2015 0819   RDW 12.0 12/26/2006 1006   LYMPHSABS 1.7 02/03/2015 0819   MONOABS 0.4 12/26/2006 1006   EOSABS 0.3 02/03/2015 0819   EOSABS 0.2 04/06/2013 1107   EOSABS 0.2 12/26/2006 1006   BASOSABS 0.1 02/03/2015 0819   BASOSABS 0.1 12/26/2006 1006    Hgb A1C Lab Results  Component Value Date   HGBA1C 5.6 02/03/2015        Assessment & Plan:   Localized edema:  No concern for DVT, given location Not c/w gout, cellulitis or phlebitis She is very concerned- erx for Lasix 20 mg daily x 3 days Keep leg elevated  If persist, follow up with PCP

## 2015-10-10 NOTE — Patient Instructions (Signed)
Edema °Edema is an abnormal buildup of fluids in your body tissues. Edema is somewhat dependent on gravity to pull the fluid to the lowest place in your body. That makes the condition more common in the legs and thighs (lower extremities). Painless swelling of the feet and ankles is common and becomes more likely as you get older. It is also common in looser tissues, like around your eyes.  °When the affected area is squeezed, the fluid may move out of that spot and leave a dent for a few moments. This dent is called pitting.  °CAUSES  °There are many possible causes of edema. Eating too much salt and being on your feet or sitting for a long time can cause edema in your legs and ankles. Hot weather may make edema worse. Common medical causes of edema include: °· Heart failure. °· Liver disease. °· Kidney disease. °· Weak blood vessels in your legs. °· Cancer. °· An injury. °· Pregnancy. °· Some medications. °· Obesity.  °SYMPTOMS  °Edema is usually painless. Your skin may look swollen or shiny.  °DIAGNOSIS  °Your health care provider may be able to diagnose edema by asking about your medical history and doing a physical exam. You may need to have tests such as X-rays, an electrocardiogram, or blood tests to check for medical conditions that may cause edema.  °TREATMENT  °Edema treatment depends on the cause. If you have heart, liver, or kidney disease, you need the treatment appropriate for these conditions. General treatment may include: °· Elevation of the affected body part above the level of your heart. °· Compression of the affected body part. Pressure from elastic bandages or support stockings squeezes the tissues and forces fluid back into the blood vessels. This keeps fluid from entering the tissues. °· Restriction of fluid and salt intake. °· Use of a water pill (diuretic). These medications are appropriate only for some types of edema. They pull fluid out of your body and make you urinate more often. This  gets rid of fluid and reduces swelling, but diuretics can have side effects. Only use diuretics as directed by your health care provider. °HOME CARE INSTRUCTIONS  °· Keep the affected body part above the level of your heart when you are lying down.   °· Do not sit still or stand for prolonged periods.   °· Do not put anything directly under your knees when lying down. °· Do not wear constricting clothing or garters on your upper legs.   °· Exercise your legs to work the fluid back into your blood vessels. This may help the swelling go down.   °· Wear elastic bandages or support stockings to reduce ankle swelling as directed by your health care provider.   °· Eat a low-salt diet to reduce fluid if your health care provider recommends it.   °· Only take medicines as directed by your health care provider.  °SEEK MEDICAL CARE IF:  °· Your edema is not responding to treatment. °· You have heart, liver, or kidney disease and notice symptoms of edema. °· You have edema in your legs that does not improve after elevating them.   °· You have sudden and unexplained weight gain. °SEEK IMMEDIATE MEDICAL CARE IF:  °· You develop shortness of breath or chest pain.   °· You cannot breathe when you lie down. °· You develop pain, redness, or warmth in the swollen areas.   °· You have heart, liver, or kidney disease and suddenly get edema. °· You have a fever and your symptoms suddenly get worse. °MAKE SURE YOU:  °·   Understand these instructions. °· Will watch your condition. °· Will get help right away if you are not doing well or get worse. °  °This information is not intended to replace advice given to you by your health care provider. Make sure you discuss any questions you have with your health care provider. °  °Document Released: 05/13/2005 Document Revised: 06/03/2014 Document Reviewed: 03/05/2013 °Elsevier Interactive Patient Education ©2016 [employer] ° °

## 2015-10-18 ENCOUNTER — Encounter: Payer: Self-pay | Admitting: Family Medicine

## 2015-10-18 ENCOUNTER — Ambulatory Visit (INDEPENDENT_AMBULATORY_CARE_PROVIDER_SITE_OTHER)
Admission: RE | Admit: 2015-10-18 | Discharge: 2015-10-18 | Disposition: A | Discharge status: Home / Self Care | Payer: 59 | Source: Ambulatory Visit | Attending: Family Medicine | Admitting: Family Medicine

## 2015-10-18 ENCOUNTER — Ambulatory Visit (INDEPENDENT_AMBULATORY_CARE_PROVIDER_SITE_OTHER): Payer: 59 | Admitting: Family Medicine

## 2015-10-18 VITALS — BP 116/74 | HR 79 | Temp 98.2°F | Ht 66.5 in | Wt 161.5 lb

## 2015-10-18 DIAGNOSIS — R600 Localized edema: Secondary | ICD-10-CM | POA: Diagnosis not present

## 2015-10-18 NOTE — Progress Notes (Signed)
Subjective:    Patient ID: Annette Hess, female    DOB: 02/18/1956, 60 y.o.   MRN: [id number]  HPI Here for 2 weeks of swelling in L leg/ankle  Uncomfortable to walk on -sore to the touch and tight feeling   Saw Annette Hess last wk - she did not suspect DVT or phlebitis Elevated Given lasix for 3d No difference   Is dependent- improved in am / but not entirely gone   On feet all day  Does not wear supp hose  No trauma or new activity   No hx of blood clot No hx of significant varicosities   No cp or sob  Patient Active Problem List   Diagnosis Date Noted  . Hyperkalemia 02/08/2015  . Alkaline phosphatase raised 02/08/2015  . Other malaise and fatigue 04/06/2013  . Stress reaction 04/06/2013  . Routine general medical examination at a health care facility 01/10/2012  . Routine gynecological examination 01/10/2012  . Colon cancer screening 01/10/2012  . Other screening mammogram 01/10/2012  . ELEVATION, TRANSAMINASE/LDH LEVELS 01/29/2007  . GESTATIONAL DIABETES 12/15/2006   Past Medical History  Diagnosis Date  . Gestational diabetes    Past Surgical History  Procedure Laterality Date  . Laparoscopy      infertility   Social History  Substance Use Topics  . Smoking status: Never Smoker   . Smokeless tobacco: None  . Alcohol Use: 0.0 oz/week    0 Standard drinks or equivalent per week     Comment: wine-occ (once a week)   Family History  Problem Relation Age of Onset  . Heart defect Father     ablation  . Cancer Mother     liver (deceased)  . Diabetes Mother   . Cancer Maternal Grandmother     uterine  . Cancer Paternal Grandmother     breast   . Breast cancer Paternal Grandmother [phone]  . Anorexia nervosa Daughter    No Known Allergies Current Outpatient Prescriptions on File Prior to Visit  Medication Sig Dispense Refill  . furosemide (LASIX) 20 MG tablet Take 1 tablet (20 mg total) by mouth daily. 3 tablet 0   No current facility-administered  medications on file prior to visit.      Review of Systems Review of Systems  Constitutional: Negative for fever, appetite change, fatigue and unexpected weight change.  Eyes: Negative for pain and visual disturbance.  Respiratory: Negative for cough and shortness of breath.   Cardiovascular: Negative for cp or palpitations    Gastrointestinal: Negative for nausea, diarrhea and constipation.  Genitourinary: Negative for urgency and frequency.  Skin: Negative for pallor or rash   MSK pos for swelling of L lower leg and ankle  Neurological: Negative for weakness, light-headedness, numbness and headaches.  Hematological: Negative for adenopathy. Does not bruise/bleed easily.  Psychiatric/Behavioral: Negative for dysphoric mood. The patient is not nervous/anxious.         Objective:   Physical Exam  Constitutional: She appears well-developed and well-nourished. No distress.  Well appearing  HENT:  Head: Normocephalic and atraumatic.  Mouth/Throat: Oropharynx is clear and moist.  Eyes: Conjunctivae and EOM are normal. Pupils are equal, round, and reactive to light.  Neck: Normal range of motion. Neck supple. No JVD present. Carotid bruit is not present. No thyromegaly present.  Cardiovascular: Normal rate, regular rhythm, normal heart sounds and intact distal pulses.  Exam reveals no gallop.   Pulmonary/Chest: Effort normal and breath sounds normal. No respiratory distress. She has no  wheezes. She has no rales. She exhibits no tenderness.  No crackles  Abdominal: Soft. Bowel sounds are normal. She exhibits no distension, no abdominal bruit and no mass. There is no tenderness.  Musculoskeletal: She exhibits edema.  One plus pitting edema of LLE from knee to ankle  Mildly tender over lower tibia  No ecchymosis  No visible varicosities  Nl rom of knee and hip  No inguinal adenopathy  Nl pedal pulses and foot temperature   Lymphadenopathy:    She has no cervical adenopathy.    Neurological: She is alert. She has normal reflexes. No cranial nerve deficit. She exhibits normal muscle tone. Coordination normal.  Skin: Skin is warm and dry. No rash noted. No pallor.  Psychiatric: She has a normal mood and affect.          Assessment & Plan:   Problem List Items Addressed This Visit      Other   Leg edema, left - Primary    2 weeks of localized edema in L leg - over tibia primarily with mild tenderness but neg homan's sign  Xray of tib/fib today (pt does not remember trauma) Also ordered LE venous doppler to look for phlebitis /baker's cyst - not very susp of DVT at this time however  inst to elevate Lasix did not help-will not continue that  Pending study results       Relevant Orders   DG Tibia/Fibula Left (Completed)   VAS [place] LOWER EXTREMITY VENOUS (DVT)

## 2015-10-18 NOTE — Assessment & Plan Note (Signed)
2 weeks of localized edema in L leg - over tibia primarily with mild tenderness but neg homan's sign  Xray of tib/fib today (pt does not remember trauma) Also ordered LE venous doppler to look for phlebitis /baker's cyst - not very susp of DVT at this time however  inst to elevate Lasix did not help-will not continue that  Pending study results

## 2015-10-18 NOTE — Patient Instructions (Signed)
Xray now - we will get to you with a report  Elevate leg when you can  Try cold compress if it helps   A support stocking to the knee is also ok  Stop at check out to arrange your leg doppler

## 2015-10-18 NOTE — Progress Notes (Signed)
Pre visit review using our clinic review tool, if applicable. No additional management support is needed unless otherwise documented below in the visit note. 

## 2015-10-19 ENCOUNTER — Ambulatory Visit (HOSPITAL_COMMUNITY)
Admission: RE | Admit: 2015-10-19 | Discharge: 2015-10-19 | Disposition: A | Discharge status: Home / Self Care | Payer: 59 | Source: Ambulatory Visit | Attending: Family Medicine | Admitting: Family Medicine

## 2015-10-19 ENCOUNTER — Telehealth: Payer: Self-pay | Admitting: Family Medicine

## 2015-10-19 DIAGNOSIS — R600 Localized edema: Secondary | ICD-10-CM | POA: Diagnosis not present

## 2015-10-19 NOTE — Telephone Encounter (Signed)
Called pt and no answer and no voicemail set up 

## 2015-10-19 NOTE — Telephone Encounter (Signed)
-----   Message from Smiley HousemanSandra E Biggs, RVT sent at 10/19/2015  9:21 AM EDT ----- Regarding: Venous Duplex Preliminary results for today's left lower extremity venous duplex is negative for DVT.

## 2015-10-19 NOTE — Telephone Encounter (Signed)
Please let pt know that I have preliminary report -no clot on venous doppler (as expected)  Pending the rest of the report-will update when it comes in

## 2015-10-24 NOTE — Telephone Encounter (Signed)
Addressed through results notes  

## 2016-02-06 ENCOUNTER — Telehealth: Payer: Self-pay | Admitting: Family Medicine

## 2016-02-06 DIAGNOSIS — Z0000 Encounter for general adult medical examination without abnormal findings: Secondary | ICD-10-CM

## 2016-02-06 NOTE — Telephone Encounter (Signed)
-----   Message from Baldomero LamyNatasha C Chavers sent at 02/06/2016  9:33 AM EDT ----- Regarding: Cpx labs Fri 9/15, need orders. Thanks! :-) Please order  future cpx labs for pt's upcoming lab appt. Thanks Rodney Boozeasha

## 2016-02-09 ENCOUNTER — Other Ambulatory Visit (INDEPENDENT_AMBULATORY_CARE_PROVIDER_SITE_OTHER): Payer: 59

## 2016-02-09 DIAGNOSIS — Z0000 Encounter for general adult medical examination without abnormal findings: Secondary | ICD-10-CM | POA: Diagnosis not present

## 2016-02-09 NOTE — Addendum Note (Signed)
Addended by: Josph MachoANCE, Rylin Saez A on: 02/09/2016 08:06 AM   Modules accepted: Orders

## 2016-02-10 LAB — CBC WITH DIFFERENTIAL/PLATELET
BASOS ABS: 0.1 10*3/uL (ref 0.0–0.2)
Basos: 1 %
EOS (ABSOLUTE): 0.2 10*3/uL (ref 0.0–0.4)
EOS: 4 %
HEMOGLOBIN: 14 g/dL (ref 11.1–15.9)
Hematocrit: 40.6 % (ref 34.0–46.6)
IMMATURE GRANS (ABS): 0 10*3/uL (ref 0.0–0.1)
IMMATURE GRANULOCYTES: 0 %
LYMPHS: 32 %
Lymphocytes Absolute: 1.7 10*3/uL (ref 0.7–3.1)
MCH: 31.3 pg (ref 26.6–33.0)
MCHC: 34.5 g/dL (ref 31.5–35.7)
MCV: 91 fL (ref 79–97)
MONOCYTES: 7 %
Monocytes Absolute: 0.4 10*3/uL (ref 0.1–0.9)
Neutrophils Absolute: 3 10*3/uL (ref 1.4–7.0)
Neutrophils: 56 %
Platelets: 317 10*3/uL (ref 150–379)
RBC: 4.48 x10E6/uL (ref 3.77–5.28)
RDW: 13.5 % (ref 12.3–15.4)
WBC: 5.4 10*3/uL (ref 3.4–10.8)

## 2016-02-10 LAB — LIPID PANEL
CHOLESTEROL TOTAL: 212 mg/dL — AB (ref 100–199)
Chol/HDL Ratio: 3.1 ratio units (ref 0.0–4.4)
HDL: 69 mg/dL (ref >39)
LDL CALC: 127 mg/dL — AB (ref 0–99)
Triglycerides: 82 mg/dL (ref 0–149)
VLDL CHOLESTEROL CAL: 16 mg/dL (ref 5–40)

## 2016-02-10 LAB — COMPREHENSIVE METABOLIC PANEL
ALBUMIN: 4.2 g/dL (ref 3.5–5.5)
ALK PHOS: 124 IU/L — AB (ref 39–117)
ALT: 13 IU/L (ref 0–32)
AST: 19 IU/L (ref 0–40)
Albumin/Globulin Ratio: 1.6 (ref 1.2–2.2)
BUN / CREAT RATIO: 18 (ref 9–23)
BUN: 12 mg/dL (ref 6–24)
Bilirubin Total: 0.5 mg/dL (ref 0.0–1.2)
CALCIUM: 9.2 mg/dL (ref 8.7–10.2)
CO2: 24 mmol/L (ref 18–29)
CREATININE: 0.65 mg/dL (ref 0.57–1.00)
Chloride: 100 mmol/L (ref 96–106)
GFR, EST AFRICAN AMERICAN: 112 mL/min/{1.73_m2} (ref >59)
GFR, EST NON AFRICAN AMERICAN: 97 mL/min/{1.73_m2} (ref >59)
GLUCOSE: 77 mg/dL (ref 65–99)
Globulin, Total: 2.7 g/dL (ref 1.5–4.5)
Potassium: 5.2 mmol/L (ref 3.5–5.2)
Sodium: 141 mmol/L (ref 134–144)
TOTAL PROTEIN: 6.9 g/dL (ref 6.0–8.5)

## 2016-02-10 LAB — TSH: TSH: 1.49 u[IU]/mL (ref 0.450–4.500)

## 2016-02-16 ENCOUNTER — Other Ambulatory Visit (HOSPITAL_COMMUNITY)
Admission: RE | Admit: 2016-02-16 | Discharge: 2016-02-16 | Disposition: A | Discharge status: Home / Self Care | Payer: 59 | Source: Ambulatory Visit | Attending: Family Medicine | Admitting: Family Medicine

## 2016-02-16 ENCOUNTER — Ambulatory Visit (INDEPENDENT_AMBULATORY_CARE_PROVIDER_SITE_OTHER): Payer: 59 | Admitting: Family Medicine

## 2016-02-16 ENCOUNTER — Encounter: Payer: Self-pay | Admitting: Family Medicine

## 2016-02-16 VITALS — BP 106/62 | HR 89 | Temp 97.5°F | Ht 66.75 in | Wt 163.2 lb

## 2016-02-16 DIAGNOSIS — Z23 Encounter for immunization: Secondary | ICD-10-CM | POA: Diagnosis not present

## 2016-02-16 DIAGNOSIS — Z1151 Encounter for screening for human papillomavirus (HPV): Secondary | ICD-10-CM | POA: Insufficient documentation

## 2016-02-16 DIAGNOSIS — Z01419 Encounter for gynecological examination (general) (routine) without abnormal findings: Secondary | ICD-10-CM | POA: Insufficient documentation

## 2016-02-16 DIAGNOSIS — Z0000 Encounter for general adult medical examination without abnormal findings: Secondary | ICD-10-CM

## 2016-02-16 DIAGNOSIS — Z1211 Encounter for screening for malignant neoplasm of colon: Secondary | ICD-10-CM | POA: Diagnosis not present

## 2016-02-16 NOTE — Assessment & Plan Note (Signed)
Pt declines colonoscopy at this time Given IFOB kit for screening

## 2016-02-16 NOTE — Progress Notes (Signed)
Pre visit review using our clinic review tool, if applicable. No additional management support is needed unless otherwise documented below in the visit note. 

## 2016-02-16 NOTE — Patient Instructions (Addendum)
Flu shot today  Please do stool kit for screening  Don't forget to schedule your mammogram in November Pap done  Labs are stable  Take care of yourself

## 2016-02-16 NOTE — Assessment & Plan Note (Signed)
Reviewed health habits including diet and exercise and skin cancer prevention Reviewed appropriate screening tests for age  Also reviewed health mt list, fam hx and immunization status , as well as social and family history   See HPI Gyn exam today Labs reviewed Flu shot today  Please do stool kit for screening  Don't forget to schedule your mammogram in November Pap done  Labs are stable  Take care of yourself

## 2016-02-16 NOTE — Assessment & Plan Note (Signed)
Routine exam with pap  No complaints  Post menopausal

## 2016-02-16 NOTE — Progress Notes (Signed)
Subjective:    Patient ID: Annette Hess, female    DOB: 08/04/1955, 60 y.o.   MRN: [id number]  HPI Here for health maintenance exam and to review chronic medical problems    Doing well overall  Leg swelling is gone   Colon cancer screening 9/13 Not ready for a colonoscopy  Will do an ifob card   Pap 8/13 Does not see gyn  No problems  Will do pap today  Flu shot-today  Mammogram 11/16- normal  Self breast exam -no lumps    Tetanus shot 8/13  Had had hep c screening-neg   Wt Readings from Last 3 Encounters:  02/16/16 163 lb 4 oz (74 kg)  10/18/15 161 lb 8 oz (73.3 kg)  10/10/15 161 lb 8 oz (73.3 kg)  very good overall - has to make a big effort to stabilize  [facility] and also walks on the treadmill Diet is pretty good  bmi of 25.7  Hx of elevated alk phos-stable today  Lab Results  Component Value Date   ALKPHOS 124 (H) 02/09/2016   Results for orders placed or performed in visit on 02/09/16  CBC with Differential/Platelet  Result Value Ref Range   WBC 5.4 3.4 - 10.8 x10E3/uL   RBC 4.48 3.77 - 5.28 x10E6/uL   Hemoglobin 14.0 11.1 - 15.9 g/dL   Hematocrit 40.6 34.0 - 46.6 %   MCV 91 79 - 97 fL   MCH 31.3 26.6 - 33.0 pg   MCHC 34.5 31.5 - 35.7 g/dL   RDW 13.5 12.3 - 15.4 %   Platelets 317 150 - 379 x10E3/uL   Neutrophils 56 %   Lymphs 32 %   Monocytes 7 %   Eos 4 %   Basos 1 %   Neutrophils Absolute 3.0 1.4 - 7.0 x10E3/uL   Lymphocytes Absolute 1.7 0.7 - 3.1 x10E3/uL   Monocytes Absolute 0.4 0.1 - 0.9 x10E3/uL   EOS (ABSOLUTE) 0.2 0.0 - 0.4 x10E3/uL   Basophils Absolute 0.1 0.0 - 0.2 x10E3/uL   Immature Granulocytes 0 %   Immature Grans (Abs) 0.0 0.0 - 0.1 x10E3/uL  Comprehensive metabolic panel  Result Value Ref Range   Glucose 77 65 - 99 mg/dL   BUN 12 6 - 24 mg/dL   Creatinine, Ser 0.65 0.57 - 1.00 mg/dL   GFR calc non Af Amer 97 >59 mL/min/1.73   GFR calc Af Amer 112 >59 mL/min/1.73   BUN/Creatinine Ratio  18 9 - 23   Sodium 141 134 - 144 mmol/L   Potassium 5.2 3.5 - 5.2 mmol/L   Chloride 100 96 - 106 mmol/L   CO2 24 18 - 29 mmol/L   Calcium 9.2 8.7 - 10.2 mg/dL   Total Protein 6.9 6.0 - 8.5 g/dL   Albumin 4.2 3.5 - 5.5 g/dL   Globulin, Total 2.7 1.5 - 4.5 g/dL   Albumin/Globulin Ratio 1.6 1.2 - 2.2   Bilirubin Total 0.5 0.0 - 1.2 mg/dL   Alkaline Phosphatase 124 (H) 39 - 117 IU/L   AST 19 0 - 40 IU/L   ALT 13 0 - 32 IU/L  Lipid panel  Result Value Ref Range   Cholesterol, Total 212 (H) 100 - 199 mg/dL   Triglycerides 82 0 - 149 mg/dL   HDL 69 >39 mg/dL   VLDL Cholesterol Cal 16 5 - 40 mg/dL   LDL Calculated 127 (H) 0 - 99 mg/dL   Chol/HDL Ratio 3.1 0.0 -  4.4 ratio units  TSH  Result Value Ref Range   TSH 1.490 0.450 - 4.500 uIU/mL     Patient Active Problem List   Diagnosis Date Noted  . Encounter for routine gynecological examination 02/16/2016  . Leg edema, left 10/18/2015  . Hyperkalemia 02/08/2015  . Alkaline phosphatase raised 02/08/2015  . Other malaise and fatigue 04/06/2013  . Stress reaction 04/06/2013  . Routine general medical examination at a health care facility 01/10/2012  . Routine gynecological examination 01/10/2012  . Colon cancer screening 01/10/2012  . Other screening mammogram 01/10/2012  . GESTATIONAL DIABETES 12/15/2006   Past Medical History:  Diagnosis Date  . Gestational diabetes    Past Surgical History:  Procedure Laterality Date  . LAPAROSCOPY     infertility   Social History  Substance Use Topics  . Smoking status: Never Smoker  . Smokeless tobacco: Never Used  . Alcohol use 0.0 oz/week     Comment: wine-occ (once a week)   Family History  Problem Relation Age of Onset  . Heart defect Father     ablation  . Cancer Mother     liver (deceased)  . Diabetes Mother   . Cancer Maternal Grandmother     uterine  . Cancer Paternal Grandmother     breast   . Breast cancer Paternal Grandmother 43  . Anorexia nervosa Daughter     No Known Allergies No current outpatient prescriptions on file prior to visit.   No current facility-administered medications on file prior to visit.     Review of Systems    Review of Systems  Constitutional: Negative for fever, appetite change, fatigue and unexpected weight change.  Eyes: Negative for pain and visual disturbance.  Respiratory: Negative for cough and shortness of breath.   Cardiovascular: Negative for cp or palpitations    Gastrointestinal: Negative for nausea, diarrhea and constipation.  Genitourinary: Negative for urgency and frequency.  Skin: Negative for pallor or rash   Neurological: Negative for weakness, light-headedness, numbness and headaches.  Hematological: Negative for adenopathy. Does not bruise/bleed easily.  Psychiatric/Behavioral: Negative for dysphoric mood. The patient is not nervous/anxious.      Objective:   Physical Exam  Constitutional: She appears well-developed and well-nourished. No distress.  Well appearing   HENT:  Head: Normocephalic and atraumatic.  Right Ear: External ear normal.  Left Ear: External ear normal.  Mouth/Throat: Oropharynx is clear and moist.  Eyes: Conjunctivae and EOM are normal. Pupils are equal, round, and reactive to light. No scleral icterus.  Neck: Normal range of motion. Neck supple. No JVD present. Carotid bruit is not present. No thyromegaly present.  Cardiovascular: Normal rate, regular rhythm, normal heart sounds and intact distal pulses.  Exam reveals no gallop.   Pulmonary/Chest: Effort normal and breath sounds normal. No respiratory distress. She has no wheezes. She exhibits no tenderness.  Abdominal: Soft. Bowel sounds are normal. She exhibits no distension, no abdominal bruit and no mass. There is no tenderness.  Genitourinary: No breast swelling, tenderness, discharge or bleeding.  Genitourinary Comments: Breast exam: No mass, nodules, thickening, tenderness, bulging, retraction, inflamation, nipple  discharge or skin changes noted.  No axillary or clavicular LA.             Anus appears normal w/o hemorrhoids or masses     External genitalia : nl appearance and hair distribution/no lesions     Urethral meatus : nl size, no lesions or prolapse     Urethra: no masses, tenderness or  scarring    Bladder : no masses or tenderness     Vagina: nl general appearance, no discharge or  Lesions, no significant cystocele  or rectocele     Cervix: no lesions/ discharge or friability    Uterus: nl size, contour, position, and mobility (not fixed) , non tender    Adnexa : no masses, tenderness, enlargement or nodularity        Musculoskeletal: Normal range of motion. She exhibits no edema or tenderness.  Lymphadenopathy:    She has no cervical adenopathy.  Neurological: She is alert. She has normal reflexes. No cranial nerve deficit. She exhibits normal muscle tone. Coordination normal.  Skin: Skin is warm and dry. No rash noted. No erythema. No pallor.  Some lentigines/solar  Psychiatric: She has a normal mood and affect.          Assessment & Plan:   Problem List Items Addressed This Visit      Other   Routine general medical examination at a health care facility - Primary    Reviewed health habits including diet and exercise and skin cancer prevention Reviewed appropriate screening tests for age  Also reviewed health mt list, fam hx and immunization status , as well as social and family history   See HPI Gyn exam today Labs reviewed Flu shot today  Please do stool kit for screening  Don't forget to schedule your mammogram in November Pap done  Labs are stable  Take care of yourself       Encounter for routine gynecological examination    Routine exam with pap  No complaints  Post menopausal       Relevant Orders   Cytology - PAP   Colon cancer screening    Pt declines colonoscopy at this time Given IFOB kit for screening      Relevant  Orders   Fecal occult blood, imunochemical    Other Visit Diagnoses    Need for influenza vaccination       Relevant Orders   Flu Vaccine QUAD 36+ mos IM (Completed)

## 2016-02-19 LAB — CYTOLOGY - PAP

## 2016-02-23 ENCOUNTER — Other Ambulatory Visit: Payer: Self-pay | Admitting: Family Medicine

## 2016-02-23 DIAGNOSIS — Z1231 Encounter for screening mammogram for malignant neoplasm of breast: Secondary | ICD-10-CM

## 2016-02-29 ENCOUNTER — Ambulatory Visit (INDEPENDENT_AMBULATORY_CARE_PROVIDER_SITE_OTHER): Payer: 59 | Admitting: Psychiatry

## 2016-02-29 DIAGNOSIS — F4323 Adjustment disorder with mixed anxiety and depressed mood: Secondary | ICD-10-CM | POA: Diagnosis not present

## 2016-03-07 ENCOUNTER — Ambulatory Visit (INDEPENDENT_AMBULATORY_CARE_PROVIDER_SITE_OTHER): Payer: 59 | Admitting: Psychiatry

## 2016-03-07 ENCOUNTER — Ambulatory Visit: Payer: 59 | Admitting: Psychiatry

## 2016-03-07 DIAGNOSIS — F4323 Adjustment disorder with mixed anxiety and depressed mood: Secondary | ICD-10-CM | POA: Diagnosis not present

## 2016-03-12 ENCOUNTER — Ambulatory Visit (INDEPENDENT_AMBULATORY_CARE_PROVIDER_SITE_OTHER): Payer: 59 | Admitting: Psychiatry

## 2016-03-12 DIAGNOSIS — F4323 Adjustment disorder with mixed anxiety and depressed mood: Secondary | ICD-10-CM

## 2016-03-19 ENCOUNTER — Ambulatory Visit (INDEPENDENT_AMBULATORY_CARE_PROVIDER_SITE_OTHER): Payer: 59 | Admitting: Psychiatry

## 2016-03-19 DIAGNOSIS — F4323 Adjustment disorder with mixed anxiety and depressed mood: Secondary | ICD-10-CM

## 2016-03-29 ENCOUNTER — Ambulatory Visit: Payer: 59

## 2016-04-09 ENCOUNTER — Ambulatory Visit
Admission: RE | Admit: 2016-04-09 | Discharge: 2016-04-09 | Disposition: A | Discharge status: Home / Self Care | Payer: 59 | Source: Ambulatory Visit | Attending: Family Medicine | Admitting: Family Medicine

## 2016-04-09 ENCOUNTER — Ambulatory Visit (INDEPENDENT_AMBULATORY_CARE_PROVIDER_SITE_OTHER): Payer: 59 | Admitting: Psychiatry

## 2016-04-09 DIAGNOSIS — F4323 Adjustment disorder with mixed anxiety and depressed mood: Secondary | ICD-10-CM

## 2016-04-09 DIAGNOSIS — Z1231 Encounter for screening mammogram for malignant neoplasm of breast: Secondary | ICD-10-CM | POA: Insufficient documentation

## 2016-04-30 ENCOUNTER — Ambulatory Visit (INDEPENDENT_AMBULATORY_CARE_PROVIDER_SITE_OTHER): Payer: 59 | Admitting: Psychiatry

## 2016-04-30 DIAGNOSIS — F4323 Adjustment disorder with mixed anxiety and depressed mood: Secondary | ICD-10-CM

## 2016-06-04 ENCOUNTER — Ambulatory Visit (INDEPENDENT_AMBULATORY_CARE_PROVIDER_SITE_OTHER): Payer: 59 | Admitting: Psychiatry

## 2016-06-04 DIAGNOSIS — F4323 Adjustment disorder with mixed anxiety and depressed mood: Secondary | ICD-10-CM

## 2017-02-07 ENCOUNTER — Telehealth: Payer: Self-pay | Admitting: Family Medicine

## 2017-02-07 DIAGNOSIS — Z0000 Encounter for general adult medical examination without abnormal findings: Secondary | ICD-10-CM

## 2017-02-07 NOTE — Telephone Encounter (Signed)
-----   Message from Alvina Chou sent at 02/04/2017  4:15 PM EDT ----- Regarding: Lab orders for Monday, 9.17.18 Patient is scheduled for CPX labs, please order future labs, Thanks , Camelia Eng

## 2017-02-10 ENCOUNTER — Other Ambulatory Visit (INDEPENDENT_AMBULATORY_CARE_PROVIDER_SITE_OTHER): Payer: 59

## 2017-02-10 DIAGNOSIS — Z1211 Encounter for screening for malignant neoplasm of colon: Secondary | ICD-10-CM

## 2017-02-10 DIAGNOSIS — Z0000 Encounter for general adult medical examination without abnormal findings: Secondary | ICD-10-CM

## 2017-02-10 NOTE — Addendum Note (Signed)
Addended by: Jaber Dunlow J on: 02/10/2017 07:38 AM   Modules accepted: Orders  

## 2017-02-10 NOTE — Addendum Note (Signed)
Addended by: Alvina Chou on: 02/10/2017 07:38 AM   Modules accepted: Orders

## 2017-02-11 LAB — COMPREHENSIVE METABOLIC PANEL
ALBUMIN: 4.3 g/dL (ref 3.6–4.8)
ALK PHOS: 123 IU/L — AB (ref 39–117)
ALT: 13 IU/L (ref 0–32)
AST: 21 IU/L (ref 0–40)
Albumin/Globulin Ratio: 1.5 (ref 1.2–2.2)
BUN/Creatinine Ratio: 20 (ref 12–28)
BUN: 16 mg/dL (ref 8–27)
Bilirubin Total: 0.4 mg/dL (ref 0.0–1.2)
CALCIUM: 9.6 mg/dL (ref 8.7–10.3)
CO2: 23 mmol/L (ref 20–29)
CREATININE: 0.81 mg/dL (ref 0.57–1.00)
Chloride: 104 mmol/L (ref 96–106)
GFR calc Af Amer: 91 mL/min/{1.73_m2} (ref >59)
GFR, EST NON AFRICAN AMERICAN: 79 mL/min/{1.73_m2} (ref >59)
Globulin, Total: 2.9 g/dL (ref 1.5–4.5)
Glucose: 87 mg/dL (ref 65–99)
Potassium: 4.8 mmol/L (ref 3.5–5.2)
Sodium: 144 mmol/L (ref 134–144)
Total Protein: 7.2 g/dL (ref 6.0–8.5)

## 2017-02-11 LAB — LIPID PANEL
CHOLESTEROL TOTAL: 242 mg/dL — AB (ref 100–199)
Chol/HDL Ratio: 3.2 ratio (ref 0.0–4.4)
HDL: 75 mg/dL (ref >39)
LDL CALC: 150 mg/dL — AB (ref 0–99)
TRIGLYCERIDES: 84 mg/dL (ref 0–149)
VLDL CHOLESTEROL CAL: 17 mg/dL (ref 5–40)

## 2017-02-11 LAB — CBC WITH DIFFERENTIAL/PLATELET
BASOS: 2 %
Basophils Absolute: 0.1 10*3/uL (ref 0.0–0.2)
EOS (ABSOLUTE): 0.2 10*3/uL (ref 0.0–0.4)
Eos: 4 %
HEMATOCRIT: 42.4 % (ref 34.0–46.6)
Hemoglobin: 14.5 g/dL (ref 11.1–15.9)
Immature Grans (Abs): 0 10*3/uL (ref 0.0–0.1)
Immature Granulocytes: 0 %
LYMPHS ABS: 1.5 10*3/uL (ref 0.7–3.1)
Lymphs: 27 %
MCH: 30.8 pg (ref 26.6–33.0)
MCHC: 34.2 g/dL (ref 31.5–35.7)
MCV: 90 fL (ref 79–97)
MONOS ABS: 0.6 10*3/uL (ref 0.1–0.9)
Monocytes: 10 %
NEUTROS ABS: 3.2 10*3/uL (ref 1.4–7.0)
Neutrophils: 57 %
Platelets: 325 10*3/uL (ref 150–379)
RBC: 4.71 x10E6/uL (ref 3.77–5.28)
RDW: 13.2 % (ref 12.3–15.4)
WBC: 5.5 10*3/uL (ref 3.4–10.8)

## 2017-02-11 LAB — TSH: TSH: 1.8 u[IU]/mL (ref 0.450–4.500)

## 2017-02-17 ENCOUNTER — Ambulatory Visit (INDEPENDENT_AMBULATORY_CARE_PROVIDER_SITE_OTHER): Payer: 59 | Admitting: Family Medicine

## 2017-02-17 ENCOUNTER — Encounter: Payer: Self-pay | Admitting: Family Medicine

## 2017-02-17 VITALS — BP 114/68 | HR 76 | Temp 98.1°F | Ht 66.25 in | Wt 177.2 lb

## 2017-02-17 DIAGNOSIS — Z23 Encounter for immunization: Secondary | ICD-10-CM | POA: Diagnosis not present

## 2017-02-17 DIAGNOSIS — E7800 Pure hypercholesterolemia, unspecified: Secondary | ICD-10-CM

## 2017-02-17 DIAGNOSIS — Z0000 Encounter for general adult medical examination without abnormal findings: Secondary | ICD-10-CM

## 2017-02-17 DIAGNOSIS — R748 Abnormal levels of other serum enzymes: Secondary | ICD-10-CM

## 2017-02-17 DIAGNOSIS — Z1211 Encounter for screening for malignant neoplasm of colon: Secondary | ICD-10-CM | POA: Diagnosis not present

## 2017-02-17 DIAGNOSIS — E785 Hyperlipidemia, unspecified: Secondary | ICD-10-CM | POA: Insufficient documentation

## 2017-02-17 NOTE — Assessment & Plan Note (Signed)
Disc goals for lipids and reasons to control them Rev labs with pt Rev low sat fat diet in detail LDL 150s  She is eating poorly due to care giving currently  Info given on low sat/trans fat diet  She plans to hold off on fast food

## 2017-02-17 NOTE — Patient Instructions (Addendum)
Don't forget to schedule your mammogram for November   For cholesterol: Avoid red meat/ fried foods/ egg yolks/ fatty breakfast meats/ butter, cheese and high fat dairy/ and shellfish    Take care of yourself - get to the gym when you can  Try to fine time for self care

## 2017-02-17 NOTE — Assessment & Plan Note (Signed)
Stable /mild Nl isoenzymes in the past Continue to watch

## 2017-02-17 NOTE — Assessment & Plan Note (Signed)
Reviewed health habits including diet and exercise and skin cancer prevention Reviewed appropriate screening tests for age  Also reviewed health mt list, fam hx and immunization status , as well as social and family history   See HPI Labs rev  Alk phos stable Cholesterol up -disc diet  Needs more time for self care

## 2017-02-17 NOTE — Assessment & Plan Note (Signed)
Pending ifob kit result from labs  Not ready for colonoscopy yet- will call when she is

## 2017-02-17 NOTE — Progress Notes (Signed)
Subjective:    Patient ID: Annette Hess, female    DOB: [birth year], 61 y.o.   MRN: [id number]  HPI Here for health maintenance exam and to review chronic medical problems    Doing ok overall-no changes   Wt Readings from Last 3 Encounters:  02/17/17 177 lb 4 oz (80.4 kg)  02/16/16 163 lb 4 oz (74 kg)  10/18/15 161 lb 8 oz (73.3 kg)  not very good in terms of diet and exercise  He is caring for her father (24) - takes all her time for self care  Eating more fast food  She does not get to the gym like she used to   28.39 kg/m  Colon cancer screening  Not interested in colonoscopy yet (perhaps in the future)  ifob neg 9/13 Got another ifob at her lab- pending result    Flu shot-wants today   Mammogram 11/17 normal [facility] breast exam -no lumps  She will schedule her own mammogram   Pap 9/17 neg with neg HPV reflex No gyn symptoms or problems   Tdap 8/13   Hx of elevated alk phos Today 123  Nl isoenzymes in the past   Cholesterol Lab Results  Component Value Date   CHOL 242 (H) 02/10/2017   CHOL 212 (H) 02/09/2016   CHOL 208 (H) 02/03/2015   Lab Results  Component Value Date   HDL 75 02/10/2017   HDL 69 02/09/2016   HDL 73 02/03/2015   Lab Results  Component Value Date   LDLCALC 150 (H) 02/10/2017   LDLCALC 127 (H) 02/09/2016   LDLCALC 118 (H) 02/03/2015   Lab Results  Component Value Date   TRIG 84 02/10/2017   TRIG 82 02/09/2016   TRIG 87 02/03/2015   Lab Results  Component Value Date   CHOLHDL 3.2 02/10/2017   CHOLHDL 3.1 02/09/2016   CHOLHDL 2.8 02/03/2015   No results found for: LDLDIRECT  More fast food and eats better  Really like french fries  Also breakfast biscuits   Labs: Results for orders placed or performed in visit on 02/10/17  Comprehensive metabolic panel  Result Value Ref Range   Glucose 87 65 - 99 mg/dL   BUN 16 8 - 27 mg/dL   Creatinine, Ser 0.81 0.57 - 1.00 mg/dL   GFR calc non Af Amer 79 >59 mL/min/1.73   GFR  calc Af Amer 91 >59 mL/min/1.73   BUN/Creatinine Ratio 20 12 - 28   Sodium 144 134 - 144 mmol/L   Potassium 4.8 3.5 - 5.2 mmol/L   Chloride 104 96 - 106 mmol/L   CO2 23 20 - 29 mmol/L   Calcium 9.6 8.7 - 10.3 mg/dL   Total Protein 7.2 6.0 - 8.5 g/dL   Albumin 4.3 3.6 - 4.8 g/dL   Globulin, Total 2.9 1.5 - 4.5 g/dL   Albumin/Globulin Ratio 1.5 1.2 - 2.2   Bilirubin Total 0.4 0.0 - 1.2 mg/dL   Alkaline Phosphatase 123 (H) 39 - 117 IU/L   AST 21 0 - 40 IU/L   ALT 13 0 - 32 IU/L  Lipid panel  Result Value Ref Range   Cholesterol, Total 242 (H) 100 - 199 mg/dL   Triglycerides 84 0 - 149 mg/dL   HDL 75 >39 mg/dL   VLDL Cholesterol Cal 17 5 - 40 mg/dL   LDL Calculated 150 (H) 0 - 99 mg/dL   Chol/HDL Ratio 3.2 0.0 - 4.4 ratio  CBC with Differential/Platelet  Result Value  Ref Range   WBC 5.5 3.4 - 10.8 x10E3/uL   RBC 4.71 3.77 - 5.28 x10E6/uL   Hemoglobin 14.5 11.1 - 15.9 g/dL   Hematocrit 42.4 34.0 - 46.6 %   MCV 90 79 - 97 fL   MCH 30.8 26.6 - 33.0 pg   MCHC 34.2 31.5 - 35.7 g/dL   RDW 13.2 12.3 - 15.4 %   Platelets 325 150 - 379 x10E3/uL   Neutrophils 57 Not Estab. %   Lymphs 27 Not Estab. %   Monocytes 10 Not Estab. %   Eos 4 Not Estab. %   Basos 2 Not Estab. %   Neutrophils Absolute 3.2 1.4 - 7.0 x10E3/uL   Lymphocytes Absolute 1.5 0.7 - 3.1 x10E3/uL   Monocytes Absolute 0.6 0.1 - 0.9 x10E3/uL   EOS (ABSOLUTE) 0.2 0.0 - 0.4 x10E3/uL   Basophils Absolute 0.1 0.0 - 0.2 x10E3/uL   Immature Granulocytes 0 Not Estab. %   Immature Grans (Abs) 0.0 0.0 - 0.1 x10E3/uL  TSH  Result Value Ref Range   TSH 1.800 0.450 - 4.500 uIU/mL      Patient Active Problem List   Diagnosis Date Noted  . Hyperlipidemia 02/17/2017  . Encounter for routine gynecological examination 02/16/2016  . Leg edema, left 10/18/2015  . Hyperkalemia 02/08/2015  . Alkaline phosphatase raised 02/08/2015  . Other malaise and fatigue 04/06/2013  . Stress reaction 04/06/2013  . Routine general medical  examination at a health care facility 01/10/2012  . Routine gynecological examination 01/10/2012  . Colon cancer screening 01/10/2012  . Other screening mammogram 01/10/2012  . GESTATIONAL DIABETES 12/15/2006   Past Medical History:  Diagnosis Date  . Gestational diabetes    Past Surgical History:  Procedure Laterality Date  . LAPAROSCOPY     infertility   Social History  Substance Use Topics  . Smoking status: Never Smoker  . Smokeless tobacco: Never Used  . Alcohol use 0.0 oz/week     Comment: wine-occ (once a week)   Family History  Problem Relation Age of Onset  . Heart defect Father        ablation  . Cancer Mother        liver (deceased)  . Diabetes Mother   . Cancer Maternal Grandmother        uterine  . Cancer Paternal Grandmother        breast   . Breast cancer Paternal Grandmother 49  . Anorexia nervosa Daughter    No Known Allergies No current outpatient prescriptions on file prior to visit.   No current facility-administered medications on file prior to visit.     Review of Systems  Constitutional: Negative for activity change, appetite change, fatigue, fever and unexpected weight change.  HENT: Negative for congestion, ear pain, rhinorrhea, sinus pressure and sore throat.   Eyes: Negative for pain, redness and visual disturbance.  Respiratory: Negative for cough, shortness of breath and wheezing.   Cardiovascular: Negative for chest pain and palpitations.  Gastrointestinal: Negative for abdominal pain, blood in stool, constipation and diarrhea.  Endocrine: Negative for polydipsia and polyuria.  Genitourinary: Negative for dysuria, frequency and urgency.  Musculoskeletal: Negative for arthralgias, back pain and myalgias.  Skin: Negative for pallor and rash.  Allergic/Immunologic: Negative for environmental allergies.  Neurological: Negative for dizziness, syncope and headaches.  Hematological: Negative for adenopathy. Does not bruise/bleed easily.    Psychiatric/Behavioral: Negative for decreased concentration and dysphoric mood. The patient is not nervous/anxious.  Pos for caregiver stress       Objective:   Physical Exam  Constitutional: She appears well-developed and well-nourished. No distress.  Well appearing   HENT:  Head: Normocephalic and atraumatic.  Right Ear: External ear normal.  Left Ear: External ear normal.  Mouth/Throat: Oropharynx is clear and moist.  Eyes: Pupils are equal, round, and reactive to light. Conjunctivae and EOM are normal. No scleral icterus.  Neck: Normal range of motion. Neck supple. No JVD present. Carotid bruit is not present. No thyromegaly present.  Cardiovascular: Normal rate, regular rhythm, normal heart sounds and intact distal pulses.  Exam reveals no gallop.   Pulmonary/Chest: Effort normal and breath sounds normal. No respiratory distress. She has no wheezes. She exhibits no tenderness.  Abdominal: Soft. Bowel sounds are normal. She exhibits no distension, no abdominal bruit and no mass. There is no tenderness.  Genitourinary: No breast swelling, tenderness, discharge or bleeding.  Genitourinary Comments: Breast exam: No mass, nodules, thickening, tenderness, bulging, retraction, inflamation, nipple discharge or skin changes noted.  No axillary or clavicular LA.      Musculoskeletal: Normal range of motion. She exhibits no edema or tenderness.  Lymphadenopathy:    She has no cervical adenopathy.  Neurological: She is alert. She has normal reflexes. No cranial nerve deficit. She exhibits normal muscle tone. Coordination normal.  Skin: Skin is warm and dry. No rash noted. No erythema. No pallor.  Solar lentigines diffusely   Psychiatric: She has a normal mood and affect.          Assessment & Plan:   Problem List Items Addressed This Visit      Other   Alkaline phosphatase raised    Stable /mild Nl isoenzymes in the past Continue to watch       Colon cancer screening     Pending ifob kit result from labs  Not ready for colonoscopy yet- will call when she is       Hyperlipidemia    Disc goals for lipids and reasons to control them Rev labs with pt Rev low sat fat diet in detail LDL 150s  She is eating poorly due to care giving currently  Info given on low sat/trans fat diet  She plans to hold off on fast food       Routine general medical examination at a health care facility - Primary    Reviewed health habits including diet and exercise and skin cancer prevention Reviewed appropriate screening tests for age  Also reviewed health mt list, fam hx and immunization status , as well as social and family history   See HPI Labs rev  Alk phos stable Cholesterol up -disc diet  Needs more time for self care

## 2017-02-20 LAB — FECAL OCCULT BLOOD, IMMUNOCHEMICAL: Fecal Occult Bld: NEGATIVE

## 2017-03-12 ENCOUNTER — Other Ambulatory Visit: Payer: Self-pay | Admitting: Family Medicine

## 2017-03-12 DIAGNOSIS — Z1231 Encounter for screening mammogram for malignant neoplasm of breast: Secondary | ICD-10-CM

## 2017-04-09 ENCOUNTER — Ambulatory Visit: Payer: 59 | Admitting: Family Medicine

## 2017-04-09 ENCOUNTER — Encounter: Payer: Self-pay | Admitting: Family Medicine

## 2017-04-09 VITALS — BP 136/90 | HR 79 | Temp 98.8°F | Wt 179.8 lb

## 2017-04-09 DIAGNOSIS — J029 Acute pharyngitis, unspecified: Secondary | ICD-10-CM

## 2017-04-09 LAB — POCT RAPID STREP A (OFFICE): RAPID STREP A SCREEN: NEGATIVE

## 2017-04-09 NOTE — Patient Instructions (Signed)
Please try ibuprofen, 2-3 tablets every 8-12 hours, Chloraseptic type spray (generic is fine) Please let me know if not better in 3-5 days or if you develop fever or worsening pain

## 2017-04-09 NOTE — Progress Notes (Signed)
   Subjective:    Patient ID: Annette RobertsLaurel M Mauss, female    DOB: 09/28/1955, 61 y.o.   MRN: [id number]  HPI This is a 61 yo female who presents today with sore throat for several weeks.  Started with cold x 2 weeks, cold symptoms seem to be getting better. No fever, cough resolving. No sick contacts.  No cough, wheeze, shortness of breath.  Has had some postnasal drainage.  Past Medical History:  Diagnosis Date  . Gestational diabetes    Past Surgical History:  Procedure Laterality Date  . LAPAROSCOPY     infertility   Family History  Problem Relation Age of Onset  . Heart defect Father        ablation  . Cancer Mother        liver (deceased)  . Diabetes Mother   . Cancer Maternal Grandmother        uterine  . Cancer Paternal Grandmother        breast   . Breast cancer Paternal Grandmother [phone]  . Anorexia nervosa Daughter    Social History   Tobacco Use  . Smoking status: Never Smoker  . Smokeless tobacco: Never Used  Substance Use Topics  . Alcohol use: Yes    Alcohol/week: 0.0 oz    Comment: wine-occ (once a week)  . Drug use: No      Review of Systems    Per HPI Objective:   Physical Exam  Constitutional: She is oriented to person, place, and time. She appears well-developed and well-nourished. No distress.  HENT:  Head: Normocephalic and atraumatic.  Right Ear: External ear normal.  Left Ear: External ear normal.  Nose: Nose normal.  Mouth/Throat: Uvula is midline. Posterior oropharyngeal erythema (mild) present. No oropharyngeal exudate, posterior oropharyngeal edema or tonsillar abscesses.  Cardiovascular: Normal rate, regular rhythm and normal heart sounds.  Pulmonary/Chest: Effort normal and breath sounds normal.  Neurological: She is alert and oriented to person, place, and time.  Skin: Skin is warm and dry. She is not diaphoretic.  Psychiatric: She has a normal mood and affect. Her behavior is normal. Judgment and thought content normal.  Vitals  reviewed.    BP 136/90 (BP Location: Right Arm, Patient Position: Sitting, Cuff Size: Normal)   Pulse 79   Temp 98.8 F (37.1 C) (Oral)   Wt 179 lb 12 oz (81.5 kg)   SpO2 95%   BMI 28.79 kg/m  Results for orders placed or performed in visit on 04/09/17  Rapid Strep A  Result Value Ref Range   Rapid Strep A Screen Negative Negative       Assessment & Plan:  1. Sore throat - Rapid Strep A- negative -  Patient Instructions  Please try ibuprofen, 2-3 tablets every 8-12 hours, Chloraseptic type spray (generic is fine) Please let me know if not better in 3-5 days or if you develop fever or worsening pain   Olean Reeeborah Mindie Rawdon, FNP-BC  [facility], [facility]  04/12/2017 1:10 PM

## 2017-04-11 ENCOUNTER — Ambulatory Visit
Admission: RE | Admit: 2017-04-11 | Discharge: 2017-04-11 | Disposition: A | Discharge status: Home / Self Care | Payer: 59 | Source: Ambulatory Visit | Attending: Family Medicine | Admitting: Family Medicine

## 2017-04-11 DIAGNOSIS — Z1231 Encounter for screening mammogram for malignant neoplasm of breast: Secondary | ICD-10-CM | POA: Insufficient documentation

## 2018-02-15 ENCOUNTER — Telehealth: Payer: Self-pay | Admitting: Family Medicine

## 2018-02-15 DIAGNOSIS — E7800 Pure hypercholesterolemia, unspecified: Secondary | ICD-10-CM

## 2018-02-15 DIAGNOSIS — Z0000 Encounter for general adult medical examination without abnormal findings: Secondary | ICD-10-CM

## 2018-02-15 DIAGNOSIS — R748 Abnormal levels of other serum enzymes: Secondary | ICD-10-CM

## 2018-02-15 NOTE — Telephone Encounter (Signed)
-----   Message from Lauren Greeson, RT sent at 02/09/2018  9:22 AM EDT ----- Regarding: Lab orders for Wednesday 02/18/18 Please enter CPE lab orders for 02/18/18. Thanks! 

## 2018-02-18 ENCOUNTER — Other Ambulatory Visit: Payer: Self-pay

## 2018-02-23 ENCOUNTER — Encounter: Payer: Self-pay | Admitting: Family Medicine

## 2018-04-03 ENCOUNTER — Telehealth: Payer: Self-pay

## 2018-04-03 NOTE — Telephone Encounter (Signed)
[facility] faxed note;Pt missed a call and returned call; pt is aware has appt on 04/07/18 for labs and plans to keep appt. If call was about something else can call pt back otherwise pt does not want a cb. Sent [facility] note for scanning.

## 2018-04-07 ENCOUNTER — Other Ambulatory Visit (INDEPENDENT_AMBULATORY_CARE_PROVIDER_SITE_OTHER): Payer: Managed Care, Other (non HMO)

## 2018-04-07 DIAGNOSIS — Z0000 Encounter for general adult medical examination without abnormal findings: Secondary | ICD-10-CM

## 2018-04-07 DIAGNOSIS — E7800 Pure hypercholesterolemia, unspecified: Secondary | ICD-10-CM

## 2018-04-07 NOTE — Addendum Note (Signed)
Addended by: WALSH, TERRI J on: 04/07/2018 07:43 AM   Modules accepted: Orders  

## 2018-04-07 NOTE — Addendum Note (Signed)
Addended by: Alvina ChouWALSH, TERRI J on: 04/07/2018 07:43 AM   Modules accepted: Orders

## 2018-04-07 NOTE — Addendum Note (Signed)
Addended by: Jiah Bari J on: 04/07/2018 07:43 AM   Modules accepted: Orders  

## 2018-04-09 LAB — CBC WITH DIFFERENTIAL/PLATELET
BASOS ABS: 0.1 10*3/uL (ref 0.0–0.2)
Basos: 2 %
EOS (ABSOLUTE): 0.2 10*3/uL (ref 0.0–0.4)
EOS: 4 %
Hematocrit: 41.3 % (ref 34.0–46.6)
Hemoglobin: 14.5 g/dL (ref 11.1–15.9)
Immature Grans (Abs): 0 10*3/uL (ref 0.0–0.1)
Immature Granulocytes: 0 %
LYMPHS ABS: 1.4 10*3/uL (ref 0.7–3.1)
Lymphs: 30 %
MCH: 30.9 pg (ref 26.6–33.0)
MCHC: 35.1 g/dL (ref 31.5–35.7)
MCV: 88 fL (ref 79–97)
MONOS ABS: 0.5 10*3/uL (ref 0.1–0.9)
Monocytes: 11 %
NEUTROS PCT: 53 %
Neutrophils Absolute: 2.4 10*3/uL (ref 1.4–7.0)
PLATELETS: 332 10*3/uL (ref 150–450)
RBC: 4.69 x10E6/uL (ref 3.77–5.28)
RDW: 12.2 % — AB (ref 12.3–15.4)
WBC: 4.6 10*3/uL (ref 3.4–10.8)

## 2018-04-09 LAB — COMPREHENSIVE METABOLIC PANEL
ALBUMIN: 4.4 g/dL (ref 3.6–4.8)
ALT: 14 IU/L (ref 0–32)
AST: 18 IU/L (ref 0–40)
Albumin/Globulin Ratio: 1.8 (ref 1.2–2.2)
Alkaline Phosphatase: 136 IU/L — ABNORMAL HIGH (ref 39–117)
BUN / CREAT RATIO: 21 (ref 12–28)
BUN: 14 mg/dL (ref 8–27)
Bilirubin Total: 0.3 mg/dL (ref 0.0–1.2)
CALCIUM: 9.3 mg/dL (ref 8.7–10.3)
CHLORIDE: 106 mmol/L (ref 96–106)
CO2: 22 mmol/L (ref 20–29)
CREATININE: 0.68 mg/dL (ref 0.57–1.00)
GFR, EST AFRICAN AMERICAN: 109 mL/min/{1.73_m2} (ref >59)
GFR, EST NON AFRICAN AMERICAN: 95 mL/min/{1.73_m2} (ref >59)
GLOBULIN, TOTAL: 2.4 g/dL (ref 1.5–4.5)
GLUCOSE: 97 mg/dL (ref 65–99)
POTASSIUM: 4.9 mmol/L (ref 3.5–5.2)
Sodium: 142 mmol/L (ref 134–144)
TOTAL PROTEIN: 6.8 g/dL (ref 6.0–8.5)

## 2018-04-09 LAB — TSH: TSH: 1.75 u[IU]/mL (ref 0.450–4.500)

## 2018-04-09 LAB — LIPID PANEL
CHOL/HDL RATIO: 3.3 ratio (ref 0.0–4.4)
Cholesterol, Total: 208 mg/dL — ABNORMAL HIGH (ref 100–199)
HDL: 63 mg/dL (ref >39)
LDL Calculated: 128 mg/dL — ABNORMAL HIGH (ref 0–99)
Triglycerides: 84 mg/dL (ref 0–149)
VLDL Cholesterol Cal: 17 mg/dL (ref 5–40)

## 2018-04-13 ENCOUNTER — Ambulatory Visit (INDEPENDENT_AMBULATORY_CARE_PROVIDER_SITE_OTHER): Payer: Managed Care, Other (non HMO) | Admitting: Family Medicine

## 2018-04-13 ENCOUNTER — Encounter: Payer: Self-pay | Admitting: Family Medicine

## 2018-04-13 VITALS — BP 124/86 | HR 86 | Temp 98.2°F | Ht 66.25 in | Wt 184.5 lb

## 2018-04-13 DIAGNOSIS — Z1211 Encounter for screening for malignant neoplasm of colon: Secondary | ICD-10-CM | POA: Diagnosis not present

## 2018-04-13 DIAGNOSIS — Z0000 Encounter for general adult medical examination without abnormal findings: Secondary | ICD-10-CM | POA: Diagnosis not present

## 2018-04-13 DIAGNOSIS — R748 Abnormal levels of other serum enzymes: Secondary | ICD-10-CM

## 2018-04-13 DIAGNOSIS — E7800 Pure hypercholesterolemia, unspecified: Secondary | ICD-10-CM | POA: Diagnosis not present

## 2018-04-13 NOTE — Assessment & Plan Note (Signed)
ifob kit - declines colonoscopy at this time

## 2018-04-13 NOTE — Assessment & Plan Note (Signed)
Continue to monitor  136 No symptoms

## 2018-04-13 NOTE — Progress Notes (Signed)
Subjective:    Patient ID: Annette Hess, female    DOB: [birth year], 62 y.o.   MRN: [id number]  HPI Here for health maintenance exam and to review chronic medical problems   Has been working and keeping baby grand daughter   Her FIL died this am  She is sad - able to function   Wt Readings from Last 3 Encounters:  04/13/18 184 lb 8 oz (83.7 kg)  04/09/17 179 lb 12 oz (81.5 kg)  02/17/17 177 lb 4 oz (80.4 kg)  she was taking care of herself better until the baby was born  Keeps baby for a month - then gets back on track (usually goes 3-4 times per week) 29.55 kg/m   Colon cancer screening -ifob 11/18  Will do ifob again   Flu shot - had that as well as Tdap   Mammogram 11/18 (will schedule it)  Self breast exam - no lumps   Pap 9/17 neg with neg HPV screen   Tetanus shot- recent Tdap   Zoster status-has not had dz or vaccine   Hx of elevated alk phos Lab Results  Component Value Date   ALT 14 04/07/2018   AST 18 04/07/2018   ALKPHOS 136 (H) 04/07/2018   BILITOT 0.3 04/07/2018     Hyperlipidemia  Lab Results  Component Value Date   CHOL 208 (H) 04/07/2018   CHOL 242 (H) 02/10/2017   CHOL 212 (H) 02/09/2016   Lab Results  Component Value Date   HDL 63 04/07/2018   HDL 75 02/10/2017   HDL 69 02/09/2016   Lab Results  Component Value Date   LDLCALC 128 (H) 04/07/2018   LDLCALC 150 (H) 02/10/2017   LDLCALC 127 (H) 02/09/2016   Lab Results  Component Value Date   TRIG 84 04/07/2018   TRIG 84 02/10/2017   TRIG 82 02/09/2016   Lab Results  Component Value Date   CHOLHDL 3.3 04/07/2018   CHOLHDL 3.2 02/10/2017   CHOLHDL 3.1 02/09/2016   No results found for: LDLDIRECT Diet controlled  LDL is down  HDL is down - but will start back with exercise   Other labs Results for orders placed or performed in visit on 04/07/18  CBC with Differential/Platelet  Result Value Ref Range   WBC 4.6 3.4 - 10.8 x10E3/uL   RBC 4.69 3.77 - 5.28 x10E6/uL   Hemoglobin 14.5 11.1 - 15.9 g/dL   Hematocrit 41.3 34.0 - 46.6 %   MCV 88 79 - 97 fL   MCH 30.9 26.6 - 33.0 pg   MCHC 35.1 31.5 - 35.7 g/dL   RDW 12.2 (L) 12.3 - 15.4 %   Platelets 332 150 - 450 x10E3/uL   Neutrophils 53 Not Estab. %   Lymphs 30 Not Estab. %   Monocytes 11 Not Estab. %   Eos 4 Not Estab. %   Basos 2 Not Estab. %   Neutrophils Absolute 2.4 1.4 - 7.0 x10E3/uL   Lymphocytes Absolute 1.4 0.7 - 3.1 x10E3/uL   Monocytes Absolute 0.5 0.1 - 0.9 x10E3/uL   EOS (ABSOLUTE) 0.2 0.0 - 0.4 x10E3/uL   Basophils Absolute 0.1 0.0 - 0.2 x10E3/uL   Immature Granulocytes 0 Not Estab. %   Immature Grans (Abs) 0.0 0.0 - 0.1 x10E3/uL  Comprehensive metabolic panel  Result Value Ref Range   Glucose 97 65 - 99 mg/dL   BUN 14 8 - 27 mg/dL   Creatinine, Ser 0.68 0.57 - 1.00 mg/dL   GFR  calc non Af Amer 95 >59 mL/min/1.73   GFR calc Af Amer 109 >59 mL/min/1.73   BUN/Creatinine Ratio 21 12 - 28   Sodium 142 134 - 144 mmol/L   Potassium 4.9 3.5 - 5.2 mmol/L   Chloride 106 96 - 106 mmol/L   CO2 22 20 - 29 mmol/L   Calcium 9.3 8.7 - 10.3 mg/dL   Total Protein 6.8 6.0 - 8.5 g/dL   Albumin 4.4 3.6 - 4.8 g/dL   Globulin, Total 2.4 1.5 - 4.5 g/dL   Albumin/Globulin Ratio 1.8 1.2 - 2.2   Bilirubin Total 0.3 0.0 - 1.2 mg/dL   Alkaline Phosphatase 136 (H) 39 - 117 IU/L   AST 18 0 - 40 IU/L   ALT 14 0 - 32 IU/L  Lipid panel  Result Value Ref Range   Cholesterol, Total 208 (H) 100 - 199 mg/dL   Triglycerides 84 0 - 149 mg/dL   HDL 63 >39 mg/dL   VLDL Cholesterol Cal 17 5 - 40 mg/dL   LDL Calculated 128 (H) 0 - 99 mg/dL   Chol/HDL Ratio 3.3 0.0 - 4.4 ratio  TSH  Result Value Ref Range   TSH 1.750 0.450 - 4.500 uIU/mL     Patient Active Problem List   Diagnosis Date Noted  . Hyperlipidemia 02/17/2017  . Encounter for routine gynecological examination 02/16/2016  . Alkaline phosphatase raised 02/08/2015  . Routine general medical examination at a health care facility 01/10/2012  .  Routine gynecological examination 01/10/2012  . Colon cancer screening 01/10/2012  . GESTATIONAL DIABETES 12/15/2006   Past Medical History:  Diagnosis Date  . Gestational diabetes    Past Surgical History:  Procedure Laterality Date  . LAPAROSCOPY     infertility   Social History   Tobacco Use  . Smoking status: Never Smoker  . Smokeless tobacco: Never Used  Substance Use Topics  . Alcohol use: Yes    Alcohol/week: 0.0 standard drinks    Comment: wine-occ (once a week)  . Drug use: No   Family History  Problem Relation Age of Onset  . Heart defect Father        ablation  . Cancer Mother        liver (deceased)  . Diabetes Mother   . Cancer Maternal Grandmother        uterine  . Cancer Paternal Grandmother        breast   . Breast cancer Paternal Grandmother 100  . Anorexia nervosa Daughter    No Known Allergies No current outpatient medications on file prior to visit.   No current facility-administered medications on file prior to visit.     Review of Systems  Constitutional: Negative for activity change, appetite change, fatigue, fever and unexpected weight change.  HENT: Negative for congestion, ear pain, rhinorrhea, sinus pressure and sore throat.   Eyes: Negative for pain, redness and visual disturbance.  Respiratory: Negative for cough, shortness of breath and wheezing.   Cardiovascular: Negative for chest pain and palpitations.  Gastrointestinal: Negative for abdominal pain, blood in stool, constipation and diarrhea.  Endocrine: Negative for polydipsia and polyuria.  Genitourinary: Negative for dysuria, frequency and urgency.  Musculoskeletal: Negative for arthralgias, back pain and myalgias.  Skin: Negative for pallor and rash.  Allergic/Immunologic: Negative for environmental allergies.  Neurological: Negative for dizziness, syncope and headaches.  Hematological: Negative for adenopathy. Does not bruise/bleed easily.  Psychiatric/Behavioral: Negative  for decreased concentration and dysphoric mood. The patient is not nervous/anxious.  Grief       Objective:   Physical Exam  Constitutional: She appears well-developed and well-nourished. No distress.  overwt and well app  HENT:  Head: Normocephalic and atraumatic.  Right Ear: External ear normal.  Left Ear: External ear normal.  Mouth/Throat: Oropharynx is clear and moist.  Eyes: Pupils are equal, round, and reactive to light. Conjunctivae and EOM are normal. No scleral icterus.  Neck: Normal range of motion. Neck supple. No JVD present. Carotid bruit is not present. No thyromegaly present.  Cardiovascular: Normal rate, regular rhythm, normal heart sounds and intact distal pulses. Exam reveals no gallop.  Pulmonary/Chest: Effort normal and breath sounds normal. No stridor. No respiratory distress. She has no wheezes. She exhibits no tenderness. No breast tenderness, discharge or bleeding.  Abdominal: Soft. Bowel sounds are normal. She exhibits no distension, no abdominal bruit and no mass. There is no tenderness.  Genitourinary: No breast tenderness, discharge or bleeding.  Genitourinary Comments: Breast exam: No mass, nodules, thickening, tenderness, bulging, retraction, inflamation, nipple discharge or skin changes noted.  No axillary or clavicular LA.      Musculoskeletal: Normal range of motion. She exhibits no edema or tenderness.  Lymphadenopathy:    She has no cervical adenopathy.  Neurological: She is alert. She has normal reflexes. She displays normal reflexes. No cranial nerve deficit. She exhibits normal muscle tone. Coordination normal.  Skin: Skin is warm and dry. No rash noted. No erythema. No pallor.  Solar lentigines diffusely   Psychiatric: She has a normal mood and affect.  Tearful when disc loss of FIL this am           Assessment & Plan:   Problem List Items Addressed This Visit      Other   Alkaline phosphatase raised    Continue to monitor   136 No symptoms        Colon cancer screening    ifob kit - declines colonoscopy at this time        Hyperlipidemia    Disc goals for lipids and reasons to control them Rev last labs with pt Rev low sat fat diet in detail LDL is down -enc her to keep working on it HDL down- but not able to exercise for another month        Routine general medical examination at a health care facility - Primary    Reviewed health habits including diet and exercise and skin cancer prevention Reviewed appropriate screening tests for age  Also reviewed health mt list, fam hx and immunization status , as well as social and family history   See HPI Labs reviewed  Disc need for ca and D for bone health  Enc exercise when she can return to it ifob kit for colon screening Due for mammogram-pt will schedule this when able  Also disc shingrix vaccine

## 2018-04-13 NOTE — Assessment & Plan Note (Signed)
Reviewed health habits including diet and exercise and skin cancer prevention Reviewed appropriate screening tests for age  Also reviewed health mt list, fam hx and immunization status , as well as social and family history   See HPI Labs reviewed  Disc need for ca and D for bone health  Enc exercise when she can return to it ifob kit for colon screening Due for mammogram-pt will schedule this when able  Also disc shingrix vaccine

## 2018-04-13 NOTE — Assessment & Plan Note (Signed)
Disc goals for lipids and reasons to control them Rev last labs with pt Rev low sat fat diet in detail LDL is down -enc her to keep working on it HDL down- but not able to exercise for another month

## 2018-04-13 NOTE — Patient Instructions (Addendum)
Make sure to schedule your mammogram   If you are interested in the new shingles vaccine (Shingrix) - call your local pharmacy to check on coverage and availability  If affordable, get on a wait list at your pharmacy to get the vaccine.  Please do the ifob kit for colon screen   Take care of yourself  Get back to exercise when you can

## 2018-07-28 ENCOUNTER — Other Ambulatory Visit: Payer: Self-pay | Admitting: Family Medicine

## 2018-07-28 DIAGNOSIS — Z1231 Encounter for screening mammogram for malignant neoplasm of breast: Secondary | ICD-10-CM

## 2018-12-18 ENCOUNTER — Ambulatory Visit
Admission: RE | Admit: 2018-12-18 | Discharge: 2018-12-18 | Disposition: A | Discharge status: Home / Self Care | Payer: Managed Care, Other (non HMO) | Source: Ambulatory Visit | Attending: Family Medicine | Admitting: Family Medicine

## 2018-12-18 DIAGNOSIS — Z1231 Encounter for screening mammogram for malignant neoplasm of breast: Secondary | ICD-10-CM | POA: Diagnosis present

## 2019-04-12 ENCOUNTER — Other Ambulatory Visit (INDEPENDENT_AMBULATORY_CARE_PROVIDER_SITE_OTHER): Payer: Managed Care, Other (non HMO)

## 2019-04-12 ENCOUNTER — Telehealth: Payer: Self-pay | Admitting: Family Medicine

## 2019-04-12 ENCOUNTER — Other Ambulatory Visit: Payer: Self-pay

## 2019-04-12 DIAGNOSIS — Z0000 Encounter for general adult medical examination without abnormal findings: Secondary | ICD-10-CM | POA: Diagnosis not present

## 2019-04-12 DIAGNOSIS — E7800 Pure hypercholesterolemia, unspecified: Secondary | ICD-10-CM

## 2019-04-12 NOTE — Telephone Encounter (Signed)
Lab orders

## 2019-04-13 LAB — CBC WITH DIFFERENTIAL/PLATELET
Basophils Absolute: 0.1 10*3/uL (ref 0.0–0.2)
Basos: 1 %
EOS (ABSOLUTE): 0.2 10*3/uL (ref 0.0–0.4)
Eos: 4 %
Hematocrit: 41.9 % (ref 34.0–46.6)
Hemoglobin: 14.4 g/dL (ref 11.1–15.9)
Immature Grans (Abs): 0 10*3/uL (ref 0.0–0.1)
Immature Granulocytes: 0 %
Lymphocytes Absolute: 1.9 10*3/uL (ref 0.7–3.1)
Lymphs: 34 %
MCH: 31 pg (ref 26.6–33.0)
MCHC: 34.4 g/dL (ref 31.5–35.7)
MCV: 90 fL (ref 79–97)
Monocytes Absolute: 0.5 10*3/uL (ref 0.1–0.9)
Monocytes: 10 %
Neutrophils Absolute: 2.8 10*3/uL (ref 1.4–7.0)
Neutrophils: 51 %
Platelets: 294 10*3/uL (ref 150–450)
RBC: 4.64 x10E6/uL (ref 3.77–5.28)
RDW: 12.5 % (ref 11.7–15.4)
WBC: 5.6 10*3/uL (ref 3.4–10.8)

## 2019-04-13 LAB — COMPREHENSIVE METABOLIC PANEL
ALT: 12 IU/L (ref 0–32)
AST: 19 IU/L (ref 0–40)
Albumin/Globulin Ratio: 1.7 (ref 1.2–2.2)
Albumin: 4.2 g/dL (ref 3.8–4.8)
Alkaline Phosphatase: 133 IU/L — ABNORMAL HIGH (ref 39–117)
BUN/Creatinine Ratio: 17 (ref 12–28)
BUN: 13 mg/dL (ref 8–27)
Bilirubin Total: 0.2 mg/dL (ref 0.0–1.2)
CO2: 20 mmol/L (ref 20–29)
Calcium: 9.3 mg/dL (ref 8.7–10.3)
Chloride: 105 mmol/L (ref 96–106)
Creatinine, Ser: 0.78 mg/dL (ref 0.57–1.00)
GFR calc Af Amer: 94 mL/min/{1.73_m2} (ref >59)
GFR calc non Af Amer: 82 mL/min/{1.73_m2} (ref >59)
Globulin, Total: 2.5 g/dL (ref 1.5–4.5)
Glucose: 87 mg/dL (ref 65–99)
Potassium: 4.8 mmol/L (ref 3.5–5.2)
Sodium: 142 mmol/L (ref 134–144)
Total Protein: 6.7 g/dL (ref 6.0–8.5)

## 2019-04-13 LAB — LIPID PANEL
Chol/HDL Ratio: 3.4 ratio (ref 0.0–4.4)
Cholesterol, Total: 213 mg/dL — ABNORMAL HIGH (ref 100–199)
HDL: 62 mg/dL (ref >39)
LDL Chol Calc (NIH): 135 mg/dL — ABNORMAL HIGH (ref 0–99)
Triglycerides: 92 mg/dL (ref 0–149)
VLDL Cholesterol Cal: 16 mg/dL (ref 5–40)

## 2019-04-13 LAB — TSH: TSH: 2.32 u[IU]/mL (ref 0.450–4.500)

## 2019-04-14 ENCOUNTER — Other Ambulatory Visit: Payer: Self-pay

## 2019-04-19 ENCOUNTER — Encounter: Payer: Self-pay | Admitting: Family Medicine

## 2019-04-19 ENCOUNTER — Ambulatory Visit (INDEPENDENT_AMBULATORY_CARE_PROVIDER_SITE_OTHER): Payer: Managed Care, Other (non HMO) | Admitting: Family Medicine

## 2019-04-19 ENCOUNTER — Other Ambulatory Visit: Payer: Self-pay

## 2019-04-19 VITALS — BP 118/70 | HR 77 | Temp 96.4°F | Ht 66.25 in | Wt 185.0 lb

## 2019-04-19 DIAGNOSIS — Z1211 Encounter for screening for malignant neoplasm of colon: Secondary | ICD-10-CM

## 2019-04-19 DIAGNOSIS — Z0000 Encounter for general adult medical examination without abnormal findings: Secondary | ICD-10-CM

## 2019-04-19 DIAGNOSIS — E7800 Pure hypercholesterolemia, unspecified: Secondary | ICD-10-CM

## 2019-04-19 DIAGNOSIS — R748 Abnormal levels of other serum enzymes: Secondary | ICD-10-CM | POA: Diagnosis not present

## 2019-04-19 NOTE — Assessment & Plan Note (Signed)
Given ifob kit Pt is not ready to schedule colonoscopy yet

## 2019-04-19 NOTE — Patient Instructions (Addendum)
If you are interested in the shingles vaccine series (Shingrix), call your insurance or pharmacy to check on coverage and location it must be given.  If affordable - you can schedule it here or at your pharmacy depending on coverage   Please do the ifob kit for colon cancer screening  Let us know when /if you want to do a colonoscopy   Labs are stable  Think about alt ways to deal with stress besides eating  For cholesterol Avoid red meat/ fried foods/ egg yolks/ fatty breakfast meats/ butter, cheese and high fat dairy/ and shellfish    Try to get 1200-1500 mg of calcium per day with at least 1000 iu of vitamin D - for bone health

## 2019-04-19 NOTE — Assessment & Plan Note (Signed)
133 Stable Nl isoenzymes in the past

## 2019-04-19 NOTE — Assessment & Plan Note (Signed)
Reviewed health habits including diet and exercise and skin cancer prevention Reviewed appropriate screening tests for age  Also reviewed health mt list, fam hx and immunization status , as well as social and family history   See HPI Labs reviewed Disc plan to eat better/stop emotional eating  Disc starting ca and D for bone health  She will check on coverage of shingrix vaccine  Declines gyn exam this year  ifob kit given for colon screening -not ready to do colonoscopy yet

## 2019-04-19 NOTE — Assessment & Plan Note (Signed)
Disc goals for lipids and reasons to control them Rev last labs with pt Rev low sat fat diet in detail LDL up slt with more junk food  She plans to change this Consider statin if no imp in future

## 2019-04-19 NOTE — Progress Notes (Signed)
Subjective:    Patient ID: Annette Hess, female    DOB: 03-30-1956, 63 y.o.   MRN: [id number]  This visit occurred during the SARS-CoV-2 public health emergency.  Safety protocols were in place, including screening questions prior to the visit, additional usage of staff PPE, and extensive cleaning of exam room while observing appropriate contact time as indicated for disinfecting solutions.    HPI Here for health maintenance exam and to review chronic medical problems    Feeling ok overall  Elderly father broke his hip recently   Working long hours and caring for elders  A lot of stressors  Works at the hospital   [employer] from Last 3 Encounters:  04/19/19 185 lb (83.9 kg)  04/13/18 184 lb 8 oz (83.7 kg)  04/09/17 179 lb 12 oz (81.5 kg)  trying to take care of herself  29.63 kg/m   Walking outside =almost every day during the week  Reads  Plays games on her phone  Diet-is not healthy / too much fast food and candy -is a stress eater  Was living with her father and not able to eat well in his house   Colon cancer screening ifob 9/18 She lost her ifob kit  Will do colonoscopy once her father is more stable    Had flu shot at her pharmacy   Pap 9/17 neg with neg HPV screen  Wants to put off a year  No new partners  No symptoms  No menses    Mammogram 7/20 Self breast exam - no lumps   Tdap 7/19   Zoster status -interested in shingrix   BP Readings from Last 3 Encounters:  04/19/19 118/70  04/13/18 124/86  04/09/17 136/90   Pulse Readings from Last 3 Encounters:  04/19/19 77  04/13/18 86  04/09/17 79    Hyperlipidemia Lab Results  Component Value Date   CHOL 213 (H) 04/12/2019   CHOL 208 (H) 04/07/2018   CHOL 242 (H) 02/10/2017   Lab Results  Component Value Date   HDL 62 04/12/2019   HDL 63 04/07/2018   HDL 75 02/10/2017   Lab Results  Component Value Date   LDLCALC 135 (H) 04/12/2019   LDLCALC 128 (H) 04/07/2018   LDLCALC 150 (H)  02/10/2017   Lab Results  Component Value Date   TRIG 92 04/12/2019   TRIG 84 04/07/2018   TRIG 84 02/10/2017   Lab Results  Component Value Date   CHOLHDL 3.4 04/12/2019   CHOLHDL 3.3 04/07/2018   CHOLHDL 3.2 02/10/2017   No results found for: LDLDIRECT   Eating fast food and candy  Emotionally eats   H/o elevated alk phos  133 today  Isoenzymes nl 4 y ago Stable   Other labs stable  Results for orders placed or performed in visit on 04/12/19  TSH  Result Value Ref Range   TSH 2.320 0.450 - 4.500 uIU/mL  Lipid Profile  Result Value Ref Range   Cholesterol, Total 213 (H) 100 - 199 mg/dL   Triglycerides 92 0 - 149 mg/dL   HDL 62 >39 mg/dL   VLDL Cholesterol Cal 16 5 - 40 mg/dL   LDL Chol Calc (NIH) 135 (H) 0 - 99 mg/dL   Chol/HDL Ratio 3.4 0.0 - 4.4 ratio  CBC with Differential/Platelet  Result Value Ref Range   WBC 5.6 3.4 - 10.8 x10E3/uL   RBC 4.64 3.77 - 5.28 x10E6/uL   Hemoglobin 14.4 11.1 - 15.9 g/dL  Hematocrit 41.9 34.0 - 46.6 %   MCV 90 79 - 97 fL   MCH 31.0 26.6 - 33.0 pg   MCHC 34.4 31.5 - 35.7 g/dL   RDW 12.5 11.7 - 15.4 %   Platelets 294 150 - 450 x10E3/uL   Neutrophils 51 Not Estab. %   Lymphs 34 Not Estab. %   Monocytes 10 Not Estab. %   Eos 4 Not Estab. %   Basos 1 Not Estab. %   Neutrophils Absolute 2.8 1.4 - 7.0 x10E3/uL   Lymphocytes Absolute 1.9 0.7 - 3.1 x10E3/uL   Monocytes Absolute 0.5 0.1 - 0.9 x10E3/uL   EOS (ABSOLUTE) 0.2 0.0 - 0.4 x10E3/uL   Basophils Absolute 0.1 0.0 - 0.2 x10E3/uL   Immature Granulocytes 0 Not Estab. %   Immature Grans (Abs) 0.0 0.0 - 0.1 x10E3/uL  Comprehensive metabolic panel  Result Value Ref Range   Glucose 87 65 - 99 mg/dL   BUN 13 8 - 27 mg/dL   Creatinine, Ser 0.78 0.57 - 1.00 mg/dL   GFR calc non Af Amer 82 >59 mL/min/1.73   GFR calc Af Amer 94 >59 mL/min/1.73   BUN/Creatinine Ratio 17 12 - 28   Sodium 142 134 - 144 mmol/L   Potassium 4.8 3.5 - 5.2 mmol/L   Chloride 105 96 - 106 mmol/L   CO2 20  20 - 29 mmol/L   Calcium 9.3 8.7 - 10.3 mg/dL   Total Protein 6.7 6.0 - 8.5 g/dL   Albumin 4.2 3.8 - 4.8 g/dL   Globulin, Total 2.5 1.5 - 4.5 g/dL   Albumin/Globulin Ratio 1.7 1.2 - 2.2   Bilirubin Total 0.2 0.0 - 1.2 mg/dL   Alkaline Phosphatase 133 (H) 39 - 117 IU/L   AST 19 0 - 40 IU/L   ALT 12 0 - 32 IU/L     Patient Active Problem List   Diagnosis Date Noted  . Hyperlipidemia 02/17/2017  . Encounter for routine gynecological examination 02/16/2016  . Alkaline phosphatase raised 02/08/2015  . Routine general medical examination at a health care facility 01/10/2012  . Routine gynecological examination 01/10/2012  . Colon cancer screening 01/10/2012  . GESTATIONAL DIABETES 12/15/2006   Past Medical History:  Diagnosis Date  . Gestational diabetes    Past Surgical History:  Procedure Laterality Date  . LAPAROSCOPY     infertility   Social History   Tobacco Use  . Smoking status: Never Smoker  . Smokeless tobacco: Never Used  Substance Use Topics  . Alcohol use: Yes    Alcohol/week: 0.0 standard drinks    Comment: wine-occ (once a week)  . Drug use: No   Family History  Problem Relation Age of Onset  . Heart defect Father        ablation  . Cancer Mother        liver (deceased)  . Diabetes Mother   . Cancer Maternal Grandmother        uterine  . Cancer Paternal Grandmother        breast   . Breast cancer Paternal Grandmother 60  . Anorexia nervosa Daughter    No Known Allergies No current outpatient medications on file prior to visit.   No current facility-administered medications on file prior to visit.      Review of Systems  Constitutional: Negative for activity change, appetite change, fatigue, fever and unexpected weight change.  HENT: Negative for congestion, ear pain, rhinorrhea, sinus pressure and sore throat.   Eyes: Negative for  pain, redness and visual disturbance.  Respiratory: Negative for cough, shortness of breath and wheezing.    Cardiovascular: Negative for chest pain and palpitations.  Gastrointestinal: Negative for abdominal pain, blood in stool, constipation and diarrhea.  Endocrine: Negative for polydipsia and polyuria.  Genitourinary: Negative for dysuria, frequency and urgency.  Musculoskeletal: Negative for arthralgias, back pain and myalgias.  Skin: Negative for pallor and rash.  Allergic/Immunologic: Negative for environmental allergies.  Neurological: Negative for dizziness, syncope and headaches.  Hematological: Negative for adenopathy. Does not bruise/bleed easily.  Psychiatric/Behavioral: Negative for decreased concentration and dysphoric mood. The patient is not nervous/anxious.        Stress- caregiver fatigue        Objective:   Physical Exam Constitutional:      General: She is not in acute distress.    Appearance: Normal appearance. She is well-developed. She is not ill-appearing or diaphoretic.     Comments: Overweight   HENT:     Head: Normocephalic and atraumatic.     Right Ear: Tympanic membrane, ear canal and external ear normal.     Left Ear: Tympanic membrane, ear canal and external ear normal.     Nose: Nose normal. No congestion.     Mouth/Throat:     Mouth: Mucous membranes are moist.     Pharynx: Oropharynx is clear. No posterior oropharyngeal erythema.  Eyes:     General: No scleral icterus.    Extraocular Movements: Extraocular movements intact.     Conjunctiva/sclera: Conjunctivae normal.     Pupils: Pupils are equal, round, and reactive to light.  Neck:     Musculoskeletal: Normal range of motion and neck supple. No neck rigidity or muscular tenderness.     Thyroid: No thyromegaly.     Vascular: No carotid bruit or JVD.  Cardiovascular:     Rate and Rhythm: Normal rate and regular rhythm.     Pulses: Normal pulses.     Heart sounds: Normal heart sounds. No gallop.   Pulmonary:     Effort: Pulmonary effort is normal. No respiratory distress.     Breath sounds:  Normal breath sounds. No wheezing.     Comments: Good air exch Chest:     Chest wall: No tenderness.  Abdominal:     General: Bowel sounds are normal. There is no distension or abdominal bruit.     Palpations: Abdomen is soft. There is no mass.     Tenderness: There is no abdominal tenderness.     Hernia: No hernia is present.  Genitourinary:    Comments: Breast exam: No mass, nodules, thickening, tenderness, bulging, retraction, inflamation, nipple discharge or skin changes noted.  No axillary or clavicular LA.     Musculoskeletal: Normal range of motion.        General: No tenderness.     Right lower leg: No edema.     Left lower leg: No edema.  Lymphadenopathy:     Cervical: No cervical adenopathy.  Skin:    General: Skin is warm and dry.     Coloration: Skin is not pale.     Findings: No erythema or rash.     Comments: Solar lentigines diffusely   Neurological:     Mental Status: She is alert. Mental status is at baseline.     Cranial Nerves: No cranial nerve deficit.     Motor: No abnormal muscle tone.     Coordination: Coordination normal.     Gait: Gait normal.  Deep Tendon Reflexes: Reflexes are normal and symmetric. Reflexes normal.  Psychiatric:        Mood and Affect: Mood normal.        Cognition and Memory: Cognition and memory normal.     Comments: Despite stressors, mood is good            Assessment & Plan:   Problem List Items Addressed This Visit      Other   Routine general medical examination at a health care facility - Primary    Reviewed health habits including diet and exercise and skin cancer prevention Reviewed appropriate screening tests for age  Also reviewed health mt list, fam hx and immunization status , as well as social and family history   See HPI Labs reviewed Disc plan to eat better/stop emotional eating  Disc starting ca and D for bone health  She will check on coverage of shingrix vaccine  Declines gyn exam this year   ifob kit given for colon screening -not ready to do colonoscopy yet        Colon cancer screening    Given ifob kit Pt is not ready to schedule colonoscopy yet      Alkaline phosphatase raised    133 Stable Nl isoenzymes in the past      Hyperlipidemia    Disc goals for lipids and reasons to control them Rev last labs with pt Rev low sat fat diet in detail LDL up slt with more junk food  She plans to change this Consider statin if no imp in future

## 2019-05-03 ENCOUNTER — Other Ambulatory Visit (INDEPENDENT_AMBULATORY_CARE_PROVIDER_SITE_OTHER): Payer: Managed Care, Other (non HMO)

## 2019-05-03 DIAGNOSIS — Z1211 Encounter for screening for malignant neoplasm of colon: Secondary | ICD-10-CM

## 2019-05-04 ENCOUNTER — Other Ambulatory Visit: Payer: Self-pay | Admitting: Family Medicine

## 2019-05-04 ENCOUNTER — Telehealth: Payer: Self-pay | Admitting: Radiology

## 2019-05-04 DIAGNOSIS — Z1211 Encounter for screening for malignant neoplasm of colon: Secondary | ICD-10-CM

## 2019-05-04 LAB — FECAL OCCULT BLOOD, IMMUNOCHEMICAL: Fecal Occult Bld: NEGATIVE

## 2019-05-04 NOTE — Telephone Encounter (Signed)
Need to ask if stool kit can be done at our lab or would you want to collect a kit for [facility] 

## 2020-02-22 ENCOUNTER — Other Ambulatory Visit: Payer: Self-pay | Admitting: Family Medicine

## 2020-02-22 DIAGNOSIS — Z1231 Encounter for screening mammogram for malignant neoplasm of breast: Secondary | ICD-10-CM

## 2020-04-11 ENCOUNTER — Telehealth: Payer: Self-pay | Admitting: Family Medicine

## 2020-04-11 DIAGNOSIS — Z0000 Encounter for general adult medical examination without abnormal findings: Secondary | ICD-10-CM

## 2020-04-11 DIAGNOSIS — E7800 Pure hypercholesterolemia, unspecified: Secondary | ICD-10-CM

## 2020-04-11 NOTE — Telephone Encounter (Signed)
-----   Message from Alvina Chou sent at 03/30/2020  4:23 PM EDT ----- Regarding: Lab orders Wednesday, 11.17.21 Patient is scheduled for CPX labs, please order future labs, Thanks , Camelia Eng

## 2020-04-12 ENCOUNTER — Other Ambulatory Visit (INDEPENDENT_AMBULATORY_CARE_PROVIDER_SITE_OTHER): Payer: Managed Care, Other (non HMO)

## 2020-04-12 ENCOUNTER — Other Ambulatory Visit: Payer: Self-pay

## 2020-04-12 DIAGNOSIS — Z0000 Encounter for general adult medical examination without abnormal findings: Secondary | ICD-10-CM

## 2020-04-12 DIAGNOSIS — E7800 Pure hypercholesterolemia, unspecified: Secondary | ICD-10-CM

## 2020-04-12 NOTE — Addendum Note (Signed)
Addended by: Alvina Chou on: 04/12/2020 08:09 AM   Modules accepted: Orders

## 2020-04-13 LAB — CBC WITH DIFFERENTIAL/PLATELET
Basophils Absolute: 0.1 10*3/uL (ref 0.0–0.2)
Basos: 2 %
EOS (ABSOLUTE): 0.2 10*3/uL (ref 0.0–0.4)
Eos: 4 %
Hematocrit: 45.3 % (ref 34.0–46.6)
Hemoglobin: 15.3 g/dL (ref 11.1–15.9)
Immature Grans (Abs): 0 10*3/uL (ref 0.0–0.1)
Immature Granulocytes: 0 %
Lymphocytes Absolute: 2.3 10*3/uL (ref 0.7–3.1)
Lymphs: 44 %
MCH: 30.6 pg (ref 26.6–33.0)
MCHC: 33.8 g/dL (ref 31.5–35.7)
MCV: 91 fL (ref 79–97)
Monocytes Absolute: 0.5 10*3/uL (ref 0.1–0.9)
Monocytes: 9 %
Neutrophils Absolute: 2.2 10*3/uL (ref 1.4–7.0)
Neutrophils: 41 %
Platelets: 343 10*3/uL (ref 150–450)
RBC: 5 x10E6/uL (ref 3.77–5.28)
RDW: 12.4 % (ref 11.7–15.4)
WBC: 5.3 10*3/uL (ref 3.4–10.8)

## 2020-04-13 LAB — COMPREHENSIVE METABOLIC PANEL
ALT: 17 IU/L (ref 0–32)
AST: 20 IU/L (ref 0–40)
Albumin/Globulin Ratio: 1.5 (ref 1.2–2.2)
Albumin: 4.1 g/dL (ref 3.8–4.8)
Alkaline Phosphatase: 131 IU/L — ABNORMAL HIGH (ref 44–121)
BUN/Creatinine Ratio: 18 (ref 12–28)
BUN: 14 mg/dL (ref 8–27)
Bilirubin Total: 0.3 mg/dL (ref 0.0–1.2)
CO2: 22 mmol/L (ref 20–29)
Calcium: 9.2 mg/dL (ref 8.7–10.3)
Chloride: 104 mmol/L (ref 96–106)
Creatinine, Ser: 0.79 mg/dL (ref 0.57–1.00)
GFR calc Af Amer: 92 mL/min/{1.73_m2} (ref >59)
GFR calc non Af Amer: 80 mL/min/{1.73_m2} (ref >59)
Globulin, Total: 2.8 g/dL (ref 1.5–4.5)
Glucose: 95 mg/dL (ref 65–99)
Potassium: 4.8 mmol/L (ref 3.5–5.2)
Sodium: 141 mmol/L (ref 134–144)
Total Protein: 6.9 g/dL (ref 6.0–8.5)

## 2020-04-13 LAB — LIPID PANEL
Chol/HDL Ratio: 3.5 ratio (ref 0.0–4.4)
Cholesterol, Total: 215 mg/dL — ABNORMAL HIGH (ref 100–199)
HDL: 61 mg/dL (ref >39)
LDL Chol Calc (NIH): 138 mg/dL — ABNORMAL HIGH (ref 0–99)
Triglycerides: 88 mg/dL (ref 0–149)
VLDL Cholesterol Cal: 16 mg/dL (ref 5–40)

## 2020-04-13 LAB — TSH: TSH: 1.45 u[IU]/mL (ref 0.450–4.500)

## 2020-04-18 ENCOUNTER — Ambulatory Visit
Admission: RE | Admit: 2020-04-18 | Discharge: 2020-04-18 | Disposition: A | Discharge status: Home / Self Care | Payer: Managed Care, Other (non HMO) | Source: Ambulatory Visit | Attending: Family Medicine | Admitting: Family Medicine

## 2020-04-18 ENCOUNTER — Other Ambulatory Visit: Payer: Self-pay

## 2020-04-18 DIAGNOSIS — Z1231 Encounter for screening mammogram for malignant neoplasm of breast: Secondary | ICD-10-CM | POA: Insufficient documentation

## 2020-04-19 ENCOUNTER — Ambulatory Visit (INDEPENDENT_AMBULATORY_CARE_PROVIDER_SITE_OTHER): Payer: Managed Care, Other (non HMO) | Admitting: Family Medicine

## 2020-04-19 ENCOUNTER — Other Ambulatory Visit: Payer: Self-pay

## 2020-04-19 ENCOUNTER — Encounter: Payer: Self-pay | Admitting: Family Medicine

## 2020-04-19 VITALS — BP 124/78 | HR 73 | Temp 97.2°F | Ht 66.25 in | Wt 187.4 lb

## 2020-04-19 DIAGNOSIS — Z1211 Encounter for screening for malignant neoplasm of colon: Secondary | ICD-10-CM

## 2020-04-19 DIAGNOSIS — E7800 Pure hypercholesterolemia, unspecified: Secondary | ICD-10-CM | POA: Diagnosis not present

## 2020-04-19 DIAGNOSIS — Z0000 Encounter for general adult medical examination without abnormal findings: Secondary | ICD-10-CM

## 2020-04-19 DIAGNOSIS — Z23 Encounter for immunization: Secondary | ICD-10-CM | POA: Diagnosis not present

## 2020-04-19 NOTE — Progress Notes (Signed)
Subjective:    Patient ID: Annette Hess, female    DOB: 03/31/1956, 64 y.o.   MRN: [id number]  This visit occurred during the SARS-CoV-2 public health emergency.  Safety protocols were in place, including screening questions prior to the visit, additional usage of staff PPE, and extensive cleaning of exam room while observing appropriate contact time as indicated for disinfecting solutions.    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  Enjoying grand kids   Feeling mostly ok  Stressed with world events /etc   Mammogram -had yesterday -pend results  Self breast exam -no lumps   Gyn status - post menop and no symptoms  Had pap in 2017 Would like to skip pap  No gyn symptoms or new partners   Colon cancer screen 12/20 neg ifob kit Wants to do that again  May consider colonoscopy next year   Tdap 7/19 Flu shot -today  covid status -immunized pfizer with booster recently  Zoster status - may be interested   BP Readings from Last 3 Encounters:  04/19/20 124/78  04/19/19 118/70  04/13/18 124/86   Pulse Readings from Last 3 Encounters:  04/19/20 73  04/19/19 77  04/13/18 86   H/o elevated alk phos  Baseline Lab Results  Component Value Date   ALT 17 04/12/2020   AST 20 04/12/2020   ALKPHOS 131 (H) 04/12/2020   BILITOT 0.3 04/12/2020   nl isoenz in the past   Hyperlipidemia Lab Results  Component Value Date   CHOL 215 (H) 04/12/2020   CHOL 213 (H) 04/12/2019   CHOL 208 (H) 04/07/2018   Lab Results  Component Value Date   HDL 61 04/12/2020   HDL 62 04/12/2019   HDL 63 04/07/2018   Lab Results  Component Value Date   LDLCALC 138 (H) 04/12/2020   LDLCALC 135 (H) 04/12/2019   LDLCALC 128 (H) 04/07/2018   Lab Results  Component Value  Date   TRIG 88 04/12/2020   TRIG 92 04/12/2019   TRIG 84 04/07/2018   Lab Results  Component Value Date   CHOLHDL 3.5 04/12/2020   CHOLHDL 3.4 04/12/2019   CHOLHDL 3.3 04/07/2018   No results found for: LDLDIRECT Diet -is not as good as it should be  (caretaking)  Does not live in a place she can cook   Does take out a lot   Family hx - unsure about cholesterol/vascular  Thinks her dad takes chol med   She is not interested in cholesterol medicines   Exercise- used to walk regularly  Went to the gym when it was hot  Now with shorter days it is more challenging    Other labs  Lab Results  Component Value Date   CREATININE 0.79 04/12/2020   BUN 14 04/12/2020   NA 141 04/12/2020   K 4.8 04/12/2020   CL 104 04/12/2020   CO2 22 04/12/2020   Lab Results  Component Value Date   WBC 5.3 04/12/2020   HGB 15.3 04/12/2020   HCT 45.3 04/12/2020   MCV 91 04/12/2020   PLT 343 04/12/2020   Lab Results  Component Value Date   TSH 1.450 04/12/2020     Patient Active Problem List  Diagnosis Date Noted  . Hyperlipidemia 02/17/2017  . Encounter for routine gynecological examination 02/16/2016  . Alkaline phosphatase raised 02/08/2015  . Routine general medical examination at a health care facility 01/10/2012  . Routine gynecological examination 01/10/2012  . Colon cancer screening 01/10/2012  . GESTATIONAL DIABETES 12/15/2006   Past Medical History:  Diagnosis Date  . Gestational diabetes    Past Surgical History:  Procedure Laterality Date  . LAPAROSCOPY     infertility   Social History   Tobacco Use  . Smoking status: Never Smoker  . Smokeless tobacco: Never Used  Substance Use Topics  . Alcohol use: Yes    Alcohol/week: 0.0 standard drinks    Comment: wine-occ (once a week)  . Drug use: No   Family History  Problem Relation Age of Onset  . Heart defect Father        ablation  . Cancer Mother        liver (deceased)  . Diabetes Mother   . Cancer  Maternal Grandmother        uterine  . Cancer Paternal Grandmother        breast   . Breast cancer Paternal Grandmother 25  . Anorexia nervosa Daughter    No Known Allergies No current outpatient medications on file prior to visit.   No current facility-administered medications on file prior to visit.     Review of Systems  Constitutional: Negative for activity change, appetite change, fatigue, fever and unexpected weight change.  HENT: Negative for congestion, ear pain, rhinorrhea, sinus pressure and sore throat.   Eyes: Negative for pain, redness and visual disturbance.  Respiratory: Negative for cough, shortness of breath and wheezing.   Cardiovascular: Negative for chest pain and palpitations.  Gastrointestinal: Negative for abdominal pain, blood in stool, constipation and diarrhea.  Endocrine: Negative for polydipsia and polyuria.  Genitourinary: Negative for dysuria, frequency and urgency.  Musculoskeletal: Negative for arthralgias, back pain and myalgias.  Skin: Negative for pallor and rash.  Allergic/Immunologic: Negative for environmental allergies.  Neurological: Negative for dizziness, syncope and headaches.  Hematological: Negative for adenopathy. Does not bruise/bleed easily.  Psychiatric/Behavioral: Negative for decreased concentration and dysphoric mood. The patient is not nervous/anxious.        Objective:   Physical Exam Constitutional:      General: She is not in acute distress.    Appearance: Normal appearance. She is well-developed. She is obese. She is not ill-appearing or diaphoretic.  HENT:     Head: Normocephalic and atraumatic.     Right Ear: Tympanic membrane, ear canal and external ear normal.     Left Ear: Tympanic membrane, ear canal and external ear normal.     Nose: Nose normal. No congestion.     Mouth/Throat:     Mouth: Mucous membranes are moist.     Pharynx: Oropharynx is clear. No posterior oropharyngeal erythema.  Eyes:     General: No  scleral icterus.    Extraocular Movements: Extraocular movements intact.     Conjunctiva/sclera: Conjunctivae normal.     Pupils: Pupils are equal, round, and reactive to light.  Neck:     Thyroid: No thyromegaly.     Vascular: No carotid bruit or JVD.  Cardiovascular:     Rate and Rhythm: Normal rate and regular rhythm.     Pulses: Normal pulses.     Heart sounds: Normal heart sounds. No gallop.   Pulmonary:     Effort: Pulmonary effort is normal. No respiratory distress.  Breath sounds: Normal breath sounds. No wheezing.     Comments: Good air exch Chest:     Chest wall: No tenderness.  Abdominal:     General: Bowel sounds are normal. There is no distension or abdominal bruit.     Palpations: Abdomen is soft. There is no mass.     Tenderness: There is no abdominal tenderness.     Hernia: No hernia is present.  Genitourinary:    Comments: Breast exam: No mass, nodules, thickening, tenderness, bulging, retraction, inflamation, nipple discharge or skin changes noted.  No axillary or clavicular LA.     Musculoskeletal:        General: No tenderness. Normal range of motion.     Cervical back: Normal range of motion and neck supple. No rigidity. No muscular tenderness.     Right lower leg: No edema.     Left lower leg: No edema.  Lymphadenopathy:     Cervical: No cervical adenopathy.  Skin:    General: Skin is warm and dry.     Coloration: Skin is not pale.     Findings: No erythema or rash.     Comments: Solar lentigines diffusely   Neurological:     Mental Status: She is alert. Mental status is at baseline.     Cranial Nerves: No cranial nerve deficit.     Motor: No abnormal muscle tone.     Coordination: Coordination normal.     Gait: Gait normal.     Deep Tendon Reflexes: Reflexes are normal and symmetric. Reflexes normal.  Psychiatric:        Mood and Affect: Mood normal.        Cognition and Memory: Cognition and memory normal.           Assessment & Plan:     Problem List Items Addressed This Visit      Other   Routine general medical examination at a health care facility - Primary    Reviewed health habits including diet and exercise and skin cancer prevention Reviewed appropriate screening tests for age  Also reviewed health mt list, fam hx and immunization status , as well as social and family history   See HPI Pending mammogram rep from yesterday  Pt opts to skip pap   No gyn problems  Given ifob kit (may consider colonoscopy in the future)  Discussed the shingrix vaccine       Colon cancer screening    Declines colonoscopy for now but may consider later Given ifob kit      Hyperlipidemia    Stable cholesterol  Disc goals for lipids and reasons to control them Rev last labs with pt Rev low sat fat diet in detail Declines cholesterol medication  LDL of 138       Other Visit Diagnoses    Need for influenza vaccination       Relevant Orders   Flu Vaccine QUAD 6+ mos PF IM (Fluarix Quad PF) (Completed)

## 2020-04-19 NOTE — Patient Instructions (Addendum)
If you are interested in the shingles vaccine series (Shingrix), call your insurance or pharmacy to check on coverage and location it must be given.  If affordable - you can schedule it here or at your pharmacy depending on coverage   For cholesterol - Avoid red meat/ fried foods/ egg yolks/ fatty breakfast meats/ butter, cheese and high fat dairy/ and shellfish    [employer] options for exercise at home - videos  Aim for 30 or more minutes per day when you can fit it in   Flu shot today   Try to get 1200-1500 mg of calcium per day with at least 1000 iu of vitamin D - for bone health   Please do the ifob kit for colon cancer screening

## 2020-04-21 NOTE — Assessment & Plan Note (Signed)
Declines colonoscopy for now but may consider later Given ifob kit

## 2020-04-21 NOTE — Assessment & Plan Note (Signed)
Stable cholesterol  Disc goals for lipids and reasons to control them Rev last labs with pt Rev low sat fat diet in detail Declines cholesterol medication  LDL of 138

## 2020-04-21 NOTE — Assessment & Plan Note (Signed)
Reviewed health habits including diet and exercise and skin cancer prevention Reviewed appropriate screening tests for age  Also reviewed health mt list, fam hx and immunization status , as well as social and family history   See HPI Pending mammogram rep from yesterday  Pt opts to skip pap   No gyn problems  Given ifob kit (may consider colonoscopy in the future)  Discussed the shingrix vaccine

## 2020-05-09 ENCOUNTER — Other Ambulatory Visit (INDEPENDENT_AMBULATORY_CARE_PROVIDER_SITE_OTHER): Payer: Managed Care, Other (non HMO)

## 2020-05-09 DIAGNOSIS — Z1211 Encounter for screening for malignant neoplasm of colon: Secondary | ICD-10-CM | POA: Diagnosis not present

## 2020-05-09 LAB — FECAL OCCULT BLOOD, IMMUNOCHEMICAL: Fecal Occult Bld: NEGATIVE

## 2020-05-23 ENCOUNTER — Ambulatory Visit: Payer: Managed Care, Other (non HMO) | Admitting: Family Medicine

## 2020-05-23 ENCOUNTER — Other Ambulatory Visit: Payer: Self-pay

## 2020-05-23 ENCOUNTER — Encounter: Payer: Self-pay | Admitting: Family Medicine

## 2020-05-23 VITALS — BP 116/70 | HR 86 | Temp 97.7°F | Ht 66.25 in | Wt 189.5 lb

## 2020-05-23 DIAGNOSIS — N3000 Acute cystitis without hematuria: Secondary | ICD-10-CM | POA: Insufficient documentation

## 2020-05-23 DIAGNOSIS — N3001 Acute cystitis with hematuria: Secondary | ICD-10-CM

## 2020-05-23 DIAGNOSIS — R300 Dysuria: Secondary | ICD-10-CM

## 2020-05-23 LAB — POC URINALSYSI DIPSTICK (AUTOMATED)
Bilirubin, UA: NEGATIVE
Blood, UA: 200
Glucose, UA: NEGATIVE
Ketones, UA: NEGATIVE
Nitrite, UA: NEGATIVE
Protein, UA: NEGATIVE
Spec Grav, UA: 1.01 (ref 1.010–1.025)
Urobilinogen, UA: 0.2 E.U./dL
pH, UA: 6 (ref 5.0–8.0)

## 2020-05-23 MED ORDER — CEPHALEXIN 250 MG PO CAPS: 250.0000 mg | ORAL_CAPSULE | Freq: Two times a day (BID) | ORAL | 0 refills | Status: DC

## 2020-05-23 NOTE — Assessment & Plan Note (Signed)
With hematuria and dysuria last night-some improvement after big water intake Adv continued water Px keflex to take as directed Culture sent to [facility]-will update further with result inst to contact us if symptoms suddenly worsen  Handout given Meds ordered this encounter  Medications  . cephALEXin (KEFLEX) 250 MG capsule    Sig: Take 1 capsule (250 mg total) by mouth 2 (two) times daily.    Dispense:  14 capsule    Refill:  0

## 2020-05-23 NOTE — Patient Instructions (Addendum)
Drink lots of water  Take the keflex as directed  We will culture urine and contact you when it returns   If symptoms suddenly worsen please call    Urinary Tract Infection, Adult  A urinary tract infection (UTI) is an infection of any part of the urinary tract. The urinary tract includes the kidneys, ureters, bladder, and urethra. These organs make, store, and get rid of urine in the body. Your health care provider may use other names to describe the infection. An upper UTI affects the ureters and kidneys (pyelonephritis). A lower UTI affects the bladder (cystitis) and urethra (urethritis). What are the causes? Most urinary tract infections are caused by bacteria in your genital area, around the entrance to your urinary tract (urethra). These bacteria grow and cause inflammation of your urinary tract. What increases the risk? You are more likely to develop this condition if:  You have a urinary catheter that stays in place (indwelling).  You are not able to control when you urinate or have a bowel movement (you have incontinence).  You are female and you: ? Use a spermicide or diaphragm for birth control. ? Have low estrogen levels. ? Are pregnant.  You have certain genes that increase your risk (genetics).  You are sexually active.  You take antibiotic medicines.  You have a condition that causes your flow of urine to slow down, such as: ? An enlarged prostate, if you are female. ? Blockage in your urethra (stricture). ? A kidney stone. ? A nerve condition that affects your bladder control (neurogenic bladder). ? Not getting enough to drink, or not urinating often.  You have certain medical conditions, such as: ? Diabetes. ? A weak disease-fighting system (immunesystem). ? Sickle cell disease. ? Gout. ? Spinal cord injury. What are the signs or symptoms? Symptoms of this condition include:  Needing to urinate right away (urgently).  Frequent urination or passing small  amounts of urine frequently.  Pain or burning with urination.  Blood in the urine.  Urine that smells bad or unusual.  Trouble urinating.  Cloudy urine.  Vaginal discharge, if you are female.  Pain in the abdomen or the lower back. You may also have:  Vomiting or a decreased appetite.  Confusion.  Irritability or tiredness.  A fever.  Diarrhea. The first symptom in older adults may be confusion. In some cases, they may not have any symptoms until the infection has worsened. How is this diagnosed? This condition is diagnosed based on your medical history and a physical exam. You may also have other tests, including:  Urine tests.  Blood tests.  Tests for sexually transmitted infections (STIs). If you have had more than one UTI, a cystoscopy or imaging studies may be done to determine the cause of the infections. How is this treated? Treatment for this condition includes:  Antibiotic medicine.  Over-the-counter medicines to treat discomfort.  Drinking enough water to stay hydrated. If you have frequent infections or have other conditions such as a kidney stone, you may need to see a health care provider who specializes in the urinary tract (urologist). In rare cases, urinary tract infections can cause sepsis. Sepsis is a life-threatening condition that occurs when the body responds to an infection. Sepsis is treated in the hospital with IV antibiotics, fluids, and other medicines. Follow these instructions at home:  Medicines  Take over-the-counter and prescription medicines only as told by your health care provider.  If you were prescribed an antibiotic medicine, take it  as told by your health care provider. Do not stop using the antibiotic even if you start to feel better. General instructions  Make sure you: ? Empty your bladder often and completely. Do not hold urine for long periods of time. ? Empty your bladder after sex. ? Wipe from front to back after a  bowel movement if you are female. Use each tissue one time when you wipe.  Drink enough fluid to keep your urine pale yellow.  Keep all follow-up visits as told by your health care provider. This is important. Contact a health care provider if:  Your symptoms do not get better after 1-2 days.  Your symptoms go away and then return. Get help right away if you have:  Severe pain in your back or your lower abdomen.  A fever.  Nausea or vomiting. Summary  A urinary tract infection (UTI) is an infection of any part of the urinary tract, which includes the kidneys, ureters, bladder, and urethra.  Most urinary tract infections are caused by bacteria in your genital area, around the entrance to your urinary tract (urethra).  Treatment for this condition often includes antibiotic medicines.  If you were prescribed an antibiotic medicine, take it as told by your health care provider. Do not stop using the antibiotic even if you start to feel better.  Keep all follow-up visits as told by your health care provider. This is important. This information is not intended to replace advice given to you by your health care provider. Make sure you discuss any questions you have with your health care provider. Document Revised: 04/30/2018 Document Reviewed: 11/20/2017 Elsevier Patient Education  2020 [employer].

## 2020-05-23 NOTE — Progress Notes (Signed)
Subjective:    Patient ID: Annette Hess, female    DOB: Aug 24, 1955, 64 y.o.   MRN: [id number]  This visit occurred during the SARS-CoV-2 public health emergency.  Safety protocols were in place, including screening questions prior to the visit, additional usage of staff PPE, and extensive cleaning of exam room while observing appropriate contact time as indicated for disinfecting solutions.    HPI Pt presents with urinary symptoms   Wt Readings from Last 3 Encounters:  05/23/20 189 lb 8 oz (86 kg)  04/19/20 187 lb 7 oz (85 kg)  04/19/19 185 lb (83.9 kg)   30.36 kg/m   Symptoms started late last night  Drinking lots of water now   Frequency with burning to urinate  Blood in urine-last night (cannot see today)  Improved with water intake  No incontinence Some urgency  No bladder pressure   No flank pain  No fever or nausea  No otc meds   Patient Active Problem List   Diagnosis Date Noted  . Acute cystitis 05/23/2020  . Hyperlipidemia 02/17/2017  . Encounter for routine gynecological examination 02/16/2016  . Alkaline phosphatase raised 02/08/2015  . Routine general medical examination at a health care facility 01/10/2012  . Routine gynecological examination 01/10/2012  . Colon cancer screening 01/10/2012  . GESTATIONAL DIABETES 12/15/2006   Past Medical History:  Diagnosis Date  . Gestational diabetes    Past Surgical History:  Procedure Laterality Date  . LAPAROSCOPY     infertility   Social History   Tobacco Use  . Smoking status: Never Smoker  . Smokeless tobacco: Never Used  Substance Use Topics  . Alcohol use: Yes    Alcohol/week: 0.0 standard drinks    Comment: wine-occ (once a week)  . Drug use: No   Family History  Problem Relation Age of Onset  . Heart defect Father        ablation  . Cancer Mother        liver (deceased)  . Diabetes Mother   . Cancer Maternal Grandmother        uterine  . Cancer Paternal Grandmother        breast    . Breast cancer Paternal Grandmother 72  . Anorexia nervosa Daughter    No Known Allergies No current outpatient medications on file prior to visit.   No current facility-administered medications on file prior to visit.    Review of Systems  Constitutional: Positive for fatigue. Negative for activity change, appetite change, chills and fever.  HENT: Negative for congestion and sore throat.   Eyes: Negative for itching and visual disturbance.  Respiratory: Negative for cough and shortness of breath.   Cardiovascular: Negative for leg swelling.  Gastrointestinal: Negative for abdominal distention, abdominal pain, constipation, diarrhea and nausea.  Endocrine: Negative for cold intolerance and polydipsia.  Genitourinary: Positive for dysuria, frequency, hematuria and urgency. Negative for difficulty urinating and flank pain.  Musculoskeletal: Negative for myalgias.  Skin: Negative for rash.  Allergic/Immunologic: Negative for immunocompromised state.  Neurological: Negative for dizziness and weakness.  Hematological: Negative for adenopathy.       Objective:   Physical Exam Constitutional:      General: She is not in acute distress.    Appearance: Normal appearance. She is well-developed and well-nourished. She is obese.  HENT:     Head: Normocephalic and atraumatic.  Eyes:     Extraocular Movements: EOM normal.     Conjunctiva/sclera: Conjunctivae normal.  Pupils: Pupils are equal, round, and reactive to light.  Cardiovascular:     Rate and Rhythm: Normal rate and regular rhythm.     Heart sounds: Normal heart sounds.  Pulmonary:     Effort: Pulmonary effort is normal.     Breath sounds: Normal breath sounds.  Abdominal:     General: Bowel sounds are normal. There is no distension.     Palpations: Abdomen is soft.     Tenderness: There is abdominal tenderness. There is no rebound.     Comments: No cva tenderness  No suprapubic tenderness or fullness     Musculoskeletal:        General: No edema.     Cervical back: Normal range of motion and neck supple.  Lymphadenopathy:     Cervical: No cervical adenopathy.  Skin:    Findings: No rash.  Neurological:     Mental Status: She is alert.  Psychiatric:        Mood and Affect: Mood and affect and mood normal.           Assessment & Plan:   Problem List Items Addressed This Visit      Genitourinary   Acute cystitis - Primary    With hematuria and dysuria last night-some improvement after big water intake Adv continued water Px keflex to take as directed Culture sent to [facility]-will update further with result inst to contact us if symptoms suddenly worsen  Handout given Meds ordered this encounter  Medications  . cephALEXin (KEFLEX) 250 MG capsule    Sig: Take 1 capsule (250 mg total) by mouth 2 (two) times daily.    Dispense:  14 capsule    Refill:  0         Relevant Orders   Urine Culture    Other Visit Diagnoses    Dysuria       Relevant Orders   POCT Urinalysis Dipstick (Automated) (Completed)

## 2020-05-27 LAB — URINE CULTURE

## 2020-05-31 ENCOUNTER — Other Ambulatory Visit: Payer: Self-pay

## 2020-05-31 ENCOUNTER — Ambulatory Visit: Payer: Managed Care, Other (non HMO) | Admitting: Internal Medicine

## 2020-05-31 ENCOUNTER — Encounter: Payer: Self-pay | Admitting: Internal Medicine

## 2020-05-31 VITALS — BP 122/78 | HR 87 | Temp 97.2°F | Wt 192.0 lb

## 2020-05-31 DIAGNOSIS — R350 Frequency of micturition: Secondary | ICD-10-CM | POA: Diagnosis not present

## 2020-05-31 DIAGNOSIS — N3000 Acute cystitis without hematuria: Secondary | ICD-10-CM

## 2020-05-31 DIAGNOSIS — R300 Dysuria: Secondary | ICD-10-CM

## 2020-05-31 LAB — POC URINALSYSI DIPSTICK (AUTOMATED)
Bilirubin, UA: NEGATIVE
Glucose, UA: NEGATIVE
Ketones, UA: NEGATIVE
Nitrite, UA: NEGATIVE
Protein, UA: NEGATIVE
Spec Grav, UA: 1.01 (ref 1.010–1.025)
Urobilinogen, UA: 0.2 E.U./dL
pH, UA: 6 (ref 5.0–8.0)

## 2020-05-31 MED ORDER — CEFTRIAXONE SODIUM 1 G IJ SOLR
1.0000 g | Freq: Once | INTRAMUSCULAR | Status: AC
  Administered 2020-05-31: 1 g via INTRAMUSCULAR

## 2020-05-31 NOTE — Patient Instructions (Signed)
Urinary Tract Infection, Adult A urinary tract infection (UTI) is an infection of any part of the urinary tract. The urinary tract includes:  The kidneys.  The ureters.  The bladder.  The urethra. These organs make, store, and get rid of pee (urine) in the body. What are the causes? This is caused by germs (bacteria) in your genital area. These germs grow and cause swelling (inflammation) of your urinary tract. What increases the risk? You are more likely to develop this condition if:  You have a small, thin tube (catheter) to drain pee.  You cannot control when you pee or poop (incontinence).  You are female, and: ? You use these methods to prevent pregnancy:  A medicine that kills sperm (spermicide).  A device that blocks sperm (diaphragm). ? You have low levels of a female hormone (estrogen). ? You are pregnant.  You have genes that add to your risk.  You are sexually active.  You take antibiotic medicines.  You have trouble peeing because of: ? A prostate that is bigger than normal, if you are female. ? A blockage in the part of your body that drains pee from the bladder (urethra). ? A kidney stone. ? A nerve condition that affects your bladder (neurogenic bladder). ? Not getting enough to drink. ? Not peeing often enough.  You have other conditions, such as: ? Diabetes. ? A weak disease-fighting system (immune system). ? Sickle cell disease. ? Gout. ? Injury of the spine. What are the signs or symptoms? Symptoms of this condition include:  Needing to pee right away (urgently).  Peeing often.  Peeing small amounts often.  Pain or burning when peeing.  Blood in the pee.  Pee that smells bad or not like normal.  Trouble peeing.  Pee that is cloudy.  Fluid coming from the vagina, if you are female.  Pain in the belly or lower back. Other symptoms include:  Throwing up (vomiting).  No urge to eat.  Feeling mixed up (confused).  Being tired  and grouchy (irritable).  A fever.  Watery poop (diarrhea). How is this treated? This condition may be treated with:  Antibiotic medicine.  Other medicines.  Drinking enough water. Follow these instructions at home:  Medicines  Take over-the-counter and prescription medicines only as told by your doctor.  If you were prescribed an antibiotic medicine, take it as told by your doctor. Do not stop taking it even if you start to feel better. General instructions  Make sure you: ? Pee until your bladder is empty. ? Do not hold pee for a long time. ? Empty your bladder after sex. ? Wipe from front to back after pooping if you are a female. Use each tissue one time when you wipe.  Drink enough fluid to keep your pee pale yellow.  Keep all follow-up visits as told by your doctor. This is important. Contact a doctor if:  You do not get better after 1-2 days.  Your symptoms go away and then come back. Get help right away if:  You have very bad back pain.  You have very bad pain in your lower belly.  You have a fever.  You are sick to your stomach (nauseous).  You are throwing up. Summary  A urinary tract infection (UTI) is an infection of any part of the urinary tract.  This condition is caused by germs in your genital area.  There are many risk factors for a UTI. These include having a small, thin   tube to drain pee and not being able to control when you pee or poop.  Treatment includes antibiotic medicines for germs.  Drink enough fluid to keep your pee pale yellow. This information is not intended to replace advice given to you by your health care provider. Make sure you discuss any questions you have with your health care provider. Document Revised: 04/30/2018 Document Reviewed: 11/20/2017 Elsevier Patient Education  2020 [employer]  

## 2020-05-31 NOTE — Progress Notes (Signed)
HPI  Pt presents to the clinic today with c/o urinary symptoms. She was treated for UTI 12/28 with Keflex, culture grew [facility]. She reports some frequency and dysuria but denies urgency, blood in her urine, low back pain, fever, chills, nausea or vaginal symptoms. She has not tried anything OTC other than her oral abx.   Review of Systems  Past Medical History:  Diagnosis Date  . Gestational diabetes     Family History  Problem Relation Age of Onset  . Heart defect Father        ablation  . Cancer Mother        liver (deceased)  . Diabetes Mother   . Cancer Maternal Grandmother        uterine  . Cancer Paternal Grandmother        breast   . Breast cancer Paternal Grandmother 87  . Anorexia nervosa Daughter     Social History   Socioeconomic History  . Marital status: Married    Spouse name: Not on file  . Number of children: Not on file  . Years of education: Not on file  . Highest education level: Not on file  Occupational History  . Not on file  Tobacco Use  . Smoking status: Never Smoker  . Smokeless tobacco: Never Used  Substance and Sexual Activity  . Alcohol use: Yes    Alcohol/week: 0.0 standard drinks    Comment: wine-occ (once a week)  . Drug use: No  . Sexual activity: Not on file  Other Topics Concern  . Not on file  Social History Narrative   Married 3 children, non smoker, no alcohol.   Social Determinants of Health   Financial Resource Strain: Not on file  Food Insecurity: Not on file  Transportation Needs: Not on file  Physical Activity: Not on file  Stress: Not on file  Social Connections: Not on file  Intimate Partner Violence: Not on file    No Known Allergies   Constitutional: Denies fever, malaise, fatigue, headache or abrupt weight changes.   GU: Pt reports frequency and pain with urination. Denies urgency, burning sensation, blood in urine, odor or discharge. Skin: Denies redness, rashes, lesions or ulcercations.   No other  specific complaints in a complete review of systems (except as listed in HPI above).    Objective:   Physical Exam  BP 122/78   Pulse 87   Temp (!) 97.2 F (36.2 C) (Temporal)   Wt 192 lb (87.1 kg)   SpO2 97%   BMI 30.76 kg/m   Wt Readings from Last 3 Encounters:  05/23/20 189 lb 8 oz (86 kg)  04/19/20 187 lb 7 oz (85 kg)  04/19/19 185 lb (83.9 kg)    General: Appears her stated age,  in NAD. Cardiovascular: Normal rate.  Pulmonary/Chest: Normal effort. Abdomen:  No CVA tenderness.        Assessment & Plan:   Frequency, Dysuria:  Urinalysis: 2+ leuks, 1+ blood Will send urine culture Rocephin 1 gm IM x 1 Drink plenty of fluids  RTC as needed or if symptoms persist. Nicki Reaper, NP This visit occurred during the SARS-CoV-2 public health emergency.  Safety protocols were in place, including screening questions prior to the visit, additional usage of staff PPE, and extensive cleaning of exam room while observing appropriate contact time as indicated for disinfecting solutions.

## 2020-06-05 LAB — URINE CULTURE

## 2020-06-08 ENCOUNTER — Encounter: Payer: Self-pay | Admitting: Internal Medicine

## 2021-03-22 ENCOUNTER — Other Ambulatory Visit: Payer: Self-pay | Admitting: Family Medicine

## 2021-03-22 DIAGNOSIS — Z1231 Encounter for screening mammogram for malignant neoplasm of breast: Secondary | ICD-10-CM

## 2021-04-17 ENCOUNTER — Other Ambulatory Visit: Payer: Managed Care, Other (non HMO)

## 2021-04-23 ENCOUNTER — Ambulatory Visit
Admission: RE | Admit: 2021-04-23 | Discharge: 2021-04-23 | Disposition: A | Discharge status: Home / Self Care | Payer: Managed Care, Other (non HMO) | Source: Ambulatory Visit | Attending: Family Medicine | Admitting: Family Medicine

## 2021-04-23 ENCOUNTER — Other Ambulatory Visit: Payer: Self-pay

## 2021-04-23 DIAGNOSIS — Z1231 Encounter for screening mammogram for malignant neoplasm of breast: Secondary | ICD-10-CM | POA: Diagnosis not present

## 2021-04-24 ENCOUNTER — Encounter: Payer: Managed Care, Other (non HMO) | Admitting: Family Medicine

## 2021-05-01 ENCOUNTER — Encounter: Payer: Managed Care, Other (non HMO) | Admitting: Family Medicine

## 2021-05-14 ENCOUNTER — Ambulatory Visit (INDEPENDENT_AMBULATORY_CARE_PROVIDER_SITE_OTHER): Payer: Managed Care, Other (non HMO) | Admitting: Family Medicine

## 2021-05-14 ENCOUNTER — Encounter: Payer: Self-pay | Admitting: Family Medicine

## 2021-05-14 ENCOUNTER — Other Ambulatory Visit: Payer: Self-pay

## 2021-05-14 VITALS — BP 128/86 | HR 80 | Temp 97.7°F | Ht 66.5 in | Wt 189.5 lb

## 2021-05-14 DIAGNOSIS — Z1211 Encounter for screening for malignant neoplasm of colon: Secondary | ICD-10-CM

## 2021-05-14 DIAGNOSIS — E7800 Pure hypercholesterolemia, unspecified: Secondary | ICD-10-CM

## 2021-05-14 DIAGNOSIS — Z0000 Encounter for general adult medical examination without abnormal findings: Secondary | ICD-10-CM

## 2021-05-14 DIAGNOSIS — M79642 Pain in left hand: Secondary | ICD-10-CM | POA: Insufficient documentation

## 2021-05-14 NOTE — Assessment & Plan Note (Signed)
Disc goals for lipids and reasons to control them Rev last labs with pt Rev low sat fat diet in detail Labs ordered  Diet controlled  

## 2021-05-14 NOTE — Assessment & Plan Note (Signed)
Dorsal/medial after reped motion at work/new position  Likely tendon  Will try voltaren gel Consider appt with Dr Patsy Lager if not improved

## 2021-05-14 NOTE — Assessment & Plan Note (Signed)
She is ready to schedule a colonoscopy for screening  Referral done

## 2021-05-14 NOTE — Progress Notes (Signed)
Subjective:    Patient ID: Annette Hess, female    DOB: March 06, 1956, 65 y.o.   MRN: [id number]  This visit occurred during the SARS-CoV-2 public health emergency.  Safety protocols were in place, including screening questions prior to the visit, additional usage of staff PPE, and extensive cleaning of exam room while observing appropriate contact time as indicated for disinfecting solutions.   HPI Here for health maintenance exam and to review chronic medical problems    Wt Readings from Last 3 Encounters:  05/14/21 189 lb 8 oz (86 kg)  05/31/20 192 lb (87.1 kg)  05/23/20 189 lb 8 oz (86 kg)   30.13 kg/m Has had a difficult 6 months   Anxious/mood  Husband fell and had a vert fracture Cares for uncle with dementia and also lived with father to care for him  Both of them died (one day apart)  Dealing with   Tries to take care of herself  Wants to get to the gym more often Walks with her sister    Colon cancer screening : ifob neg 04/2020 Wants to go ahead with colonoscopy    Zoster status : interested in shingrix    Pap 01/2016  Wants to hold off on pap  No gyn problems No new partners   Ca and D intake  Tries to keep up with that   No falls  No fractures    Mammogram 11/22 Self breast exa PGM breast cancer at age 40   Covid immunized, up to date  Flu shot - up to date this season Tdap 11/2017  BP Readings from Last 3 Encounters:  05/14/21 128/86  05/31/20 122/78  05/23/20 116/70   Pulse Readings from Last 3 Encounters:  05/14/21 80  05/31/20 87  05/23/20 86      Hyperlipidemia  Lab Results  Component Value Date   CHOL 215 (H) 04/12/2020   HDL 61 04/12/2020   LDLCALC 138 (H) 04/12/2020   TRIG 88 04/12/2020   CHOLHDL 3.5 04/12/2020   Diet controlled   Left handed  Work requires more fine movement with L hand (she is left handed) Takes tylenol for it  Back of hand bothers her at times  No swelling  No numbness or tingling     Patient Active Problem List   Diagnosis Date Noted   Left hand pain 05/14/2021   Hyperlipidemia 02/17/2017   Encounter for routine gynecological examination 02/16/2016   Alkaline phosphatase raised 02/08/2015   Routine general medical examination at a health care facility 01/10/2012   Routine gynecological examination 01/10/2012   Colon cancer screening 01/10/2012   GESTATIONAL DIABETES 12/15/2006   Past Medical History:  Diagnosis Date   Gestational diabetes    Past Surgical History:  Procedure Laterality Date   LAPAROSCOPY     infertility   Social History   Tobacco Use   Smoking status: Never   Smokeless tobacco: Never  Substance Use Topics   Alcohol use: Yes    Alcohol/week: 0.0 standard drinks    Comment: wine-occ (once a week)   Drug use: No   Family History  Problem Relation Age of Onset   Heart defect Father        ablation   Cancer Mother        liver (deceased)   Diabetes Mother    Cancer Maternal Grandmother        uterine   Cancer Paternal Grandmother        breast  Breast cancer Paternal Grandmother 63   Anorexia nervosa Daughter    No Known Allergies No current outpatient medications on file prior to visit.   No current facility-administered medications on file prior to visit.    Review of Systems  Constitutional:  Positive for fatigue. Negative for activity change, appetite change, fever and unexpected weight change.  HENT:  Negative for congestion, ear pain, rhinorrhea, sinus pressure and sore throat.   Eyes:  Negative for pain, redness and visual disturbance.  Respiratory:  Negative for cough, shortness of breath and wheezing.   Cardiovascular:  Negative for chest pain and palpitations.  Gastrointestinal:  Negative for abdominal pain, blood in stool, constipation and diarrhea.  Endocrine: Negative for polydipsia and polyuria.  Genitourinary:  Negative for dysuria, frequency and urgency.  Musculoskeletal:  Negative for arthralgias,  back pain and myalgias.       L hand pain    Skin:  Negative for pallor and rash.  Allergic/Immunologic: Negative for environmental allergies.  Neurological:  Negative for dizziness, syncope and headaches.  Hematological:  Negative for adenopathy. Does not bruise/bleed easily.  Psychiatric/Behavioral:  Negative for decreased concentration and dysphoric mood. The patient is nervous/anxious.        Stressors      Objective:   Physical Exam Constitutional:      General: She is not in acute distress.    Appearance: Normal appearance. She is well-developed. She is obese. She is not ill-appearing or diaphoretic.  HENT:     Head: Normocephalic and atraumatic.     Right Ear: Tympanic membrane, ear canal and external ear normal.     Left Ear: Tympanic membrane, ear canal and external ear normal.     Nose: Nose normal. No congestion.     Mouth/Throat:     Mouth: Mucous membranes are moist.     Pharynx: Oropharynx is clear. No posterior oropharyngeal erythema.  Eyes:     General: No scleral icterus.    Extraocular Movements: Extraocular movements intact.     Conjunctiva/sclera: Conjunctivae normal.     Pupils: Pupils are equal, round, and reactive to light.  Neck:     Thyroid: No thyromegaly.     Vascular: No carotid bruit or JVD.  Cardiovascular:     Rate and Rhythm: Normal rate and regular rhythm.     Pulses: Normal pulses.     Heart sounds: Normal heart sounds.    No gallop.  Pulmonary:     Effort: Pulmonary effort is normal. No respiratory distress.     Breath sounds: Normal breath sounds. No wheezing.     Comments: Good air exch Chest:     Chest wall: No tenderness.  Abdominal:     General: Bowel sounds are normal. There is no distension or abdominal bruit.     Palpations: Abdomen is soft. There is no mass.     Tenderness: There is no abdominal tenderness.     Hernia: No hernia is present.  Genitourinary:    Comments: Breast exam: No mass, nodules, thickening, tenderness,  bulging, retraction, inflamation, nipple discharge or skin changes noted.  No axillary or clavicular LA.     Musculoskeletal:        General: No tenderness. Normal range of motion.     Cervical back: Normal range of motion and neck supple. No rigidity. No muscular tenderness.     Right lower leg: No edema.     Left lower leg: No edema.     Comments: No kyphosis  Lymphadenopathy:     Cervical: No cervical adenopathy.  Skin:    General: Skin is warm and dry.     Coloration: Skin is not pale.     Findings: No erythema or rash.     Comments: Solar lentigines diffusely   Neurological:     Mental Status: She is alert. Mental status is at baseline.     Cranial Nerves: No cranial nerve deficit.     Motor: No abnormal muscle tone.     Coordination: Coordination normal.     Gait: Gait normal.     Deep Tendon Reflexes: Reflexes are normal and symmetric. Reflexes normal.  Psychiatric:        Mood and Affect: Mood normal.        Cognition and Memory: Cognition and memory normal.          Assessment & Plan:   Problem List Items Addressed This Visit       Other   Routine general medical examination at a health care facility - Primary    Reviewed health habits including diet and exercise and skin cancer prevention Reviewed appropriate screening tests for age  Also reviewed health mt list, fam hx and immunization status , as well as social and family history   See HPI Labs ordered  Colonoscopy referral done  Interested in shingrix and will check on coverage Declines pap/gyn exam  Discussed ca and D intake for bone health  Mammogram utd  Other imms utd       Relevant Orders   CBC with Differential/Platelet   Comprehensive metabolic panel   Lipid panel   TSH   Colon cancer screening    She is ready to schedule a colonoscopy for screening  Referral done       Relevant Orders   Ambulatory referral to Gastroenterology   Hyperlipidemia    Disc goals for lipids and reasons to  control them Rev last labs with pt Rev low sat fat diet in detail  Labs ordered  Diet controlled       Relevant Orders   Lipid panel

## 2021-05-14 NOTE — Patient Instructions (Addendum)
You will get a call to set up a colonoscopy   If you are interested in the shingles vaccine series (Shingrix), call your insurance or pharmacy to check on coverage and location it must be given.  If affordable - you can schedule it here or at your pharmacy depending on coverage   Try to get 1200-1500 mg of calcium per day with at least 1000 iu of vitamin D - for bone health   Consider an appointment with Dr Patsy Lager for your left hand pain  Try voltaren gel over the counter   Take care of yourself  Fit in exercise when you c

## 2021-05-14 NOTE — Assessment & Plan Note (Signed)
Reviewed health habits including diet and exercise and skin cancer prevention Reviewed appropriate screening tests for age  Also reviewed health mt list, fam hx and immunization status , as well as social and family history   See HPI Labs ordered  Colonoscopy referral done  Interested in shingrix and will check on coverage Declines pap/gyn exam  Discussed ca and D intake for bone health  Mammogram utd  Other imms utd

## 2021-05-15 LAB — COMPREHENSIVE METABOLIC PANEL
ALT: 46 IU/L — ABNORMAL HIGH (ref 0–32)
AST: 49 IU/L — ABNORMAL HIGH (ref 0–40)
Albumin/Globulin Ratio: 1.8 (ref 1.2–2.2)
Albumin: 4.3 g/dL (ref 3.8–4.8)
Alkaline Phosphatase: 127 IU/L — ABNORMAL HIGH (ref 44–121)
BUN/Creatinine Ratio: 14 (ref 12–28)
BUN: 11 mg/dL (ref 8–27)
Bilirubin Total: 0.4 mg/dL (ref 0.0–1.2)
CO2: 22 mmol/L (ref 20–29)
Calcium: 9.3 mg/dL (ref 8.7–10.3)
Chloride: 104 mmol/L (ref 96–106)
Creatinine, Ser: 0.81 mg/dL (ref 0.57–1.00)
Globulin, Total: 2.4 g/dL (ref 1.5–4.5)
Glucose: 91 mg/dL (ref 70–99)
Potassium: 4.7 mmol/L (ref 3.5–5.2)
Sodium: 139 mmol/L (ref 134–144)
Total Protein: 6.7 g/dL (ref 6.0–8.5)
eGFR: 81 mL/min/{1.73_m2} (ref >59)

## 2021-05-15 LAB — CBC WITH DIFFERENTIAL/PLATELET
Basophils Absolute: 0.1 10*3/uL (ref 0.0–0.2)
Basos: 2 %
EOS (ABSOLUTE): 0.2 10*3/uL (ref 0.0–0.4)
Eos: 5 %
Hematocrit: 45.5 % (ref 34.0–46.6)
Hemoglobin: 15 g/dL (ref 11.1–15.9)
Immature Grans (Abs): 0 10*3/uL (ref 0.0–0.1)
Immature Granulocytes: 0 %
Lymphocytes Absolute: 0.9 10*3/uL (ref 0.7–3.1)
Lymphs: 26 %
MCH: 30.3 pg (ref 26.6–33.0)
MCHC: 33 g/dL (ref 31.5–35.7)
MCV: 92 fL (ref 79–97)
Monocytes Absolute: 0.4 10*3/uL (ref 0.1–0.9)
Monocytes: 10 %
Neutrophils Absolute: 2.1 10*3/uL (ref 1.4–7.0)
Neutrophils: 57 %
Platelets: 314 10*3/uL (ref 150–450)
RBC: 4.95 x10E6/uL (ref 3.77–5.28)
RDW: 12.7 % (ref 11.7–15.4)
WBC: 3.7 10*3/uL (ref 3.4–10.8)

## 2021-05-15 LAB — LIPID PANEL
Chol/HDL Ratio: 4.1 ratio (ref 0.0–4.4)
Cholesterol, Total: 226 mg/dL — ABNORMAL HIGH (ref 100–199)
HDL: 55 mg/dL (ref >39)
LDL Chol Calc (NIH): 151 mg/dL — ABNORMAL HIGH (ref 0–99)
Triglycerides: 112 mg/dL (ref 0–149)
VLDL Cholesterol Cal: 20 mg/dL (ref 5–40)

## 2021-05-15 LAB — TSH: TSH: 1.08 u[IU]/mL (ref 0.450–4.500)

## 2021-05-30 ENCOUNTER — Telehealth: Payer: Self-pay

## 2021-05-30 ENCOUNTER — Other Ambulatory Visit: Payer: Self-pay

## 2021-05-30 DIAGNOSIS — Z1211 Encounter for screening for malignant neoplasm of colon: Secondary | ICD-10-CM

## 2021-05-30 MED ORDER — CLENPIQ 10-3.5-12 MG-GM -GM/160ML PO SOLN: 1.0000 | ORAL | 0 refills | Status: DC

## 2021-05-30 NOTE — Progress Notes (Signed)
Gastroenterology Pre-Procedure Review  Request Date: 06/27/2021 Requesting Physician: Dr. Allegra Lai  PATIENT REVIEW QUESTIONS: The patient responded to the following health history questions as indicated:    1. Are you having any GI issues? no 2. Do you have a personal history of Polyps? no 3. Do you have a family history of Colon Cancer or Polyps? no 4. Diabetes Mellitus? no 5. Joint replacements in the past 12 months?no 6. Major health problems in the past 3 months?no 7. Any artificial heart valves, MVP, or defibrillator?no    MEDICATIONS & ALLERGIES:    Patient reports the following regarding taking any anticoagulation/antiplatelet therapy:   Plavix, Coumadin, Eliquis, Xarelto, Lovenox, Pradaxa, Brilinta, or Effient? no Aspirin? no  Patient confirms/reports the following medications:  No current outpatient medications on file.   No current facility-administered medications for this visit.    Patient confirms/reports the following allergies:  No Known Allergies  No orders of the defined types were placed in this encounter.   AUTHORIZATION INFORMATION Primary Insurance: 1D#: Group #:  Secondary Insurance: 1D#: Group #:  SCHEDULE INFORMATION: Date: 06/27/2021 Time: Location:[facility]

## 2021-05-30 NOTE — Telephone Encounter (Signed)
Scheduled for 06/27/2021 °

## 2021-06-27 ENCOUNTER — Ambulatory Visit
Admission: RE | Admit: 2021-06-27 | Discharge: 2021-06-27 | Disposition: A | Discharge status: Home / Self Care | Payer: Managed Care, Other (non HMO) | Attending: Gastroenterology | Admitting: Gastroenterology

## 2021-06-27 ENCOUNTER — Ambulatory Visit: Payer: Managed Care, Other (non HMO) | Admitting: Certified Registered"

## 2021-06-27 ENCOUNTER — Other Ambulatory Visit: Payer: Self-pay

## 2021-06-27 ENCOUNTER — Encounter: Payer: Self-pay | Admitting: Gastroenterology

## 2021-06-27 ENCOUNTER — Encounter
Admission: RE | Disposition: A | Discharge status: Home / Self Care | Payer: Self-pay | Source: Home / Self Care | Attending: Gastroenterology

## 2021-06-27 DIAGNOSIS — Z1211 Encounter for screening for malignant neoplasm of colon: Secondary | ICD-10-CM | POA: Diagnosis present

## 2021-06-27 HISTORY — PX: COLONOSCOPY WITH PROPOFOL: SHX5780

## 2021-06-27 SURGERY — COLONOSCOPY WITH PROPOFOL
Anesthesia: General

## 2021-06-27 MED ORDER — LIDOCAINE 2% (20 MG/ML) 5 ML SYRINGE
INTRAMUSCULAR | Status: DC | PRN
Start: 2021-06-27 — End: 2021-06-27
  Administered 2021-06-27: 20 mg via INTRAVENOUS

## 2021-06-27 MED ORDER — PROPOFOL 500 MG/50ML IV EMUL
INTRAVENOUS | Status: DC | PRN
  Administered 2021-06-27: 120 ug/kg/min via INTRAVENOUS

## 2021-06-27 MED ORDER — MIDAZOLAM HCL 2 MG/2ML IJ SOLN
INTRAMUSCULAR | Status: AC
  Filled 2021-06-27: qty 2

## 2021-06-27 MED ORDER — MIDAZOLAM HCL 5 MG/5ML IJ SOLN
INTRAMUSCULAR | Status: DC | PRN
  Administered 2021-06-27: 2 mg via INTRAVENOUS

## 2021-06-27 MED ORDER — SODIUM CHLORIDE 0.9 % IV SOLN: INTRAVENOUS | Status: DC

## 2021-06-27 MED ORDER — PROPOFOL 10 MG/ML IV BOLUS
INTRAVENOUS | Status: DC | PRN
  Administered 2021-06-27: 70 mg via INTRAVENOUS

## 2021-06-27 NOTE — Anesthesia Preprocedure Evaluation (Signed)
Anesthesia Evaluation  Patient identified by MRN, date of birth, ID band Patient awake    Reviewed: Allergy & Precautions, H&P , NPO status , Patient's Chart, lab work & pertinent test results, reviewed documented beta blocker date and time   Airway Mallampati: II   Neck ROM: full    Dental  (+) Poor Dentition   Pulmonary neg pulmonary ROS,    Pulmonary exam normal        Cardiovascular negative cardio ROS Normal cardiovascular exam Rhythm:regular Rate:Normal     Neuro/Psych negative neurological ROS  negative psych ROS   GI/Hepatic negative GI ROS, Neg liver ROS,   Endo/Other  negative endocrine ROSdiabetes  Renal/GU negative Renal ROS  negative genitourinary   Musculoskeletal   Abdominal   Peds  Hematology negative hematology ROS (+)   Anesthesia Other Findings Past Medical History: No date: Gestational diabetes Past Surgical History: No date: LAPAROSCOPY     Comment:  infertility   Reproductive/Obstetrics negative OB ROS                             Anesthesia Physical Anesthesia Plan  ASA: 2  Anesthesia Plan: General   Post-op Pain Management:    Induction:   PONV Risk Score and Plan:   Airway Management Planned:   Additional Equipment:   Intra-op Plan:   Post-operative Plan:   Informed Consent: I have reviewed the patients History and Physical, chart, labs and discussed the procedure including the risks, benefits and alternatives for the proposed anesthesia with the patient or authorized representative who has indicated his/her understanding and acceptance.     Dental Advisory Given  Plan Discussed with: CRNA  Anesthesia Plan Comments:         Anesthesia Quick Evaluation

## 2021-06-27 NOTE — Transfer of Care (Signed)
Immediate Anesthesia Transfer of Care Note  Patient: Annette Hess  Procedure(s) Performed: COLONOSCOPY WITH PROPOFOL  Patient Location: Endoscopy Unit  Anesthesia Type:General  Level of Consciousness: awake  Airway & Oxygen Therapy: Patient Spontanous Breathing  Post-op Assessment: Report given to RN and Post -op Vital signs reviewed and stable  Post vital signs: Reviewed  Last Vitals:  Vitals Value Taken Time  BP 98/76 06/27/21 1135  Temp    Pulse 89 06/27/21 1136  Resp 18 06/27/21 1136  SpO2 100 % 06/27/21 1136  Vitals shown include unvalidated device data.  Last Pain:  Vitals:   06/27/21 1025  TempSrc: Temporal  PainSc: 0-No pain         Complications: No notable events documented.

## 2021-06-27 NOTE — H&P (Signed)
Annette Darby, MD [address]  [place]  [place], [place] [address]  Main: [phone]  Fax: [phone] Pager: [phone]  Primary Care Physician:  Hess, Annette Fanny, MD Primary Gastroenterologist:  Dr. Cephas Hess  Pre-Procedure History & Physical: HPI:  Annette Hess is a 66 y.o. female is here for an colonoscopy.   Past Medical History:  Diagnosis Date   Gestational diabetes     Past Surgical History:  Procedure Laterality Date   LAPAROSCOPY     infertility    Prior to Admission medications   Medication Sig Start Date End Date Taking? Authorizing Provider  Sod Picosulfate-Mag Ox-Cit Acd (CLENPIQ) 10-3.5-12 MG-GM -GM/160ML SOLN Take 1 kit by mouth as directed. At 5 PM evening before procedure, drink 1 bottle of Clenpiq, hydrate, drink (5) 8 oz of water. Then do the same thing 5 hours prior to your procedure. 05/30/21   Lin Landsman, MD    Allergies as of 05/30/2021   (No Known Allergies)    Family History  Problem Relation Age of Onset   Heart defect Father        ablation   Cancer Mother        liver (deceased)   Diabetes Mother    Cancer Maternal Grandmother        uterine   Cancer Paternal Grandmother        breast    Breast cancer Paternal Grandmother 58   Anorexia nervosa Daughter     Social History   Socioeconomic History   Marital status: Married    Spouse name: Not on file   Number of children: Not on file   Years of education: Not on file   Highest education level: Not on file  Occupational History   Not on file  Tobacco Use   Smoking status: Never   Smokeless tobacco: Never  Substance and Sexual Activity   Alcohol use: Yes    Alcohol/week: 0.0 standard drinks    Comment: wine-occ (once a week)   Drug use: No   Sexual activity: Not on file  Other Topics Concern   Not on file  Social History Narrative   Married 3 children, non smoker, no alcohol.   Social Determinants of Health   Financial Resource Strain: Not  on file  Food Insecurity: Not on file  Transportation Needs: Not on file  Physical Activity: Not on file  Stress: Not on file  Social Connections: Not on file  Intimate Partner Violence: Not on file    Review of Systems: See HPI, otherwise negative ROS  Physical Exam: BP 138/70    Pulse 74    Temp (!) 96.4 F (35.8 C) (Temporal)    Resp 16    Ht '5\' 6"'  (1.676 m)    Wt 85.7 kg    SpO2 100%    BMI 30.51 kg/m  General:   Alert,  pleasant and cooperative in NAD Head:  Normocephalic and atraumatic. Neck:  Supple; no masses or thyromegaly. Lungs:  Clear throughout to auscultation.    Heart:  Regular rate and rhythm. Abdomen:  Soft, nontender and nondistended. Normal bowel sounds, without guarding, and without rebound.   Neurologic:  Alert and  oriented x4;  grossly normal neurologically.  Impression/Plan: Annette Hess is here for an colonoscopy to be performed for colon cancer screening  Risks, benefits, limitations, and alternatives regarding  colonoscopy have been reviewed with the patient.  Questions have been answered.  All parties agreeable.  Sherri Sear, MD  06/27/2021, 11:04 AM

## 2021-06-27 NOTE — Op Note (Signed)
[facility] Gastroenterology Patient Name: Annette Hess Procedure Date: 06/27/2021 10:55 AM MRN: [id number] Account #: [id number] Date of Birth: Aug 01, 1955 Admit Type: Outpatient Age: 66 Room: [facility] ENDO ROOM 4 Gender: Female Note Status: Finalized Instrument Name: Prentice Docker [id number] Procedure:             Colonoscopy Indications:           Screening for colorectal malignant neoplasm, This is                         the patient's first colonoscopy Providers:             Toney Reil MD, MD Referring MD:          Audrie Gallus. Tower (Referring MD) Medicines:             General Anesthesia Complications:         No immediate complications. Estimated blood loss: None. Procedure:             Pre-Anesthesia Assessment:                        - Prior to the procedure, a History and Physical was                         performed, and patient medications and allergies were                         reviewed. The patient is competent. The risks and                         benefits of the procedure and the sedation options and                         risks were discussed with the patient. All questions                         were answered and informed consent was obtained.                         Patient identification and proposed procedure were                         verified by the physician, the nurse, the                         anesthesiologist, the anesthetist and the technician                         in the pre-procedure area in the procedure room in the                         endoscopy suite. Mental Status Examination: alert and                         oriented. Airway Examination: normal oropharyngeal                         airway and neck mobility. Respiratory Examination:  clear to auscultation. CV Examination: normal.                         Prophylactic Antibiotics: The patient does not require                         prophylactic  antibiotics. Prior Anticoagulants: The                         patient has taken no previous anticoagulant or                         antiplatelet agents. ASA Grade Assessment: II - A                         patient with mild systemic disease. After reviewing                         the risks and benefits, the patient was deemed in                         satisfactory condition to undergo the procedure. The                         anesthesia plan was to use general anesthesia.                         Immediately prior to administration of medications,                         the patient was re-assessed for adequacy to receive                         sedatives. The heart rate, respiratory rate, oxygen                         saturations, blood pressure, adequacy of pulmonary                         ventilation, and response to care were monitored                         throughout the procedure. The physical status of the                         patient was re-assessed after the procedure.                        After obtaining informed consent, the colonoscope was                         passed under direct vision. Throughout the procedure,                         the patient's blood pressure, pulse, and oxygen                         saturations were monitored continuously. The  Colonoscope was introduced through the anus and                         advanced to the the cecum, identified by appendiceal                         orifice and ileocecal valve. The colonoscopy was                         performed without difficulty. The patient tolerated                         the procedure well. The quality of the bowel                         preparation was evaluated using the BBPS [facility] Bowel                         Preparation Scale) with scores of: Right Colon = 3,                         Transverse Colon = 3 and Left Colon = 3 (entire mucosa                          seen well with no residual staining, small fragments                         of stool or opaque liquid). The total BBPS score                         equals 9. Findings:      The perianal and digital rectal examinations were normal. Pertinent       negatives include normal sphincter tone and no palpable rectal lesions.      The entire examined colon appeared normal.      The retroflexed view of the distal rectum and anal verge was normal and       showed no anal or rectal abnormalities. Impression:            - The entire examined colon is normal.                        - The distal rectum and anal verge are normal on                         retroflexion view.                        - No specimens collected. Recommendation:        - Discharge patient to home (with escort).                        - Resume previous diet today.                        - Continue present medications.                        - Repeat colonoscopy in 10 years  for screening                         purposes. Procedure Code(s):     --- Professional ---                        P5093, Colorectal cancer screening; colonoscopy on                         individual not meeting criteria for high risk Diagnosis Code(s):     --- Professional ---                        Z12.11, Encounter for screening for malignant neoplasm                         of colon CPT copyright 2019 American Medical Association. All rights reserved. The codes documented in this report are preliminary and upon coder review may  be revised to meet current compliance requirements. Dr. Libby Maw Toney Reil MD, MD 06/27/2021 11:30:46 AM This report has been signed electronically. Number of Addenda: 0 Note Initiated On: 06/27/2021 10:55 AM Scope Withdrawal Time: 0 hours 10 minutes 48 seconds  Total Procedure Duration: 0 hours 16 minutes 17 seconds  Estimated Blood Loss:  Estimated blood loss: none.      [facility]

## 2021-07-01 ENCOUNTER — Telehealth: Payer: Self-pay | Admitting: Family Medicine

## 2021-07-01 DIAGNOSIS — R748 Abnormal levels of other serum enzymes: Secondary | ICD-10-CM | POA: Insufficient documentation

## 2021-07-01 DIAGNOSIS — E7800 Pure hypercholesterolemia, unspecified: Secondary | ICD-10-CM

## 2021-07-01 NOTE — Anesthesia Postprocedure Evaluation (Signed)
Anesthesia Post Note  Patient: CYNDEE GIAMMARCO  Procedure(s) Performed: COLONOSCOPY WITH PROPOFOL  Patient location during evaluation: PACU Anesthesia Type: General Level of consciousness: awake and alert Pain management: pain level controlled Vital Signs Assessment: post-procedure vital signs reviewed and stable Respiratory status: spontaneous breathing, nonlabored ventilation, respiratory function stable and patient connected to nasal cannula oxygen Cardiovascular status: blood pressure returned to baseline and stable Postop Assessment: no apparent nausea or vomiting Anesthetic complications: no   No notable events documented.   Last Vitals:  Vitals:   06/27/21 1155 06/27/21 1200  BP: 131/67   Pulse: 69 74  Resp: 13 16  Temp:    SpO2: 100% 100%    Last Pain:  Vitals:   06/28/21 0735  TempSrc:   PainSc: 0-No pain                 Yevette Edwards

## 2021-07-01 NOTE — Telephone Encounter (Signed)
-----   Message from Ellamae Sia sent at 06/25/2021  3:06 PM EST ----- Regarding: Lab orders for Tuesday, 2.7.23 Lab orders, thanks

## 2021-07-03 ENCOUNTER — Other Ambulatory Visit: Payer: Managed Care, Other (non HMO)

## 2021-07-05 ENCOUNTER — Telehealth: Payer: Self-pay | Admitting: Family Medicine

## 2021-07-05 DIAGNOSIS — R748 Abnormal levels of other serum enzymes: Secondary | ICD-10-CM

## 2021-07-05 DIAGNOSIS — Z0000 Encounter for general adult medical examination without abnormal findings: Secondary | ICD-10-CM

## 2021-07-05 DIAGNOSIS — E7800 Pure hypercholesterolemia, unspecified: Secondary | ICD-10-CM

## 2021-07-05 NOTE — Telephone Encounter (Signed)
-----   Message from Alvina Chou sent at 06/29/2021 10:07 AM EST ----- Regarding: Lab orders for Friday, 2.10.23 Patient is scheduled for CPX labs, please order future labs, Thanks , Camelia Eng

## 2021-07-06 ENCOUNTER — Other Ambulatory Visit: Payer: Self-pay

## 2021-07-06 ENCOUNTER — Other Ambulatory Visit (INDEPENDENT_AMBULATORY_CARE_PROVIDER_SITE_OTHER): Payer: Managed Care, Other (non HMO)

## 2021-07-06 DIAGNOSIS — E7800 Pure hypercholesterolemia, unspecified: Secondary | ICD-10-CM

## 2021-07-06 DIAGNOSIS — Z0000 Encounter for general adult medical examination without abnormal findings: Secondary | ICD-10-CM

## 2021-07-06 DIAGNOSIS — R748 Abnormal levels of other serum enzymes: Secondary | ICD-10-CM

## 2021-07-06 NOTE — Addendum Note (Signed)
Addended by: Trellis Paganini D on: 07/06/2021 08:31 AM   Modules accepted: Orders

## 2021-07-06 NOTE — Addendum Note (Signed)
Addended by: Yeilyn Gent D on: 07/06/2021 08:31 AM ° ° Modules accepted: Orders ° °

## 2021-07-06 NOTE — Addendum Note (Signed)
Addended by: Simuel Stebner D on: 07/06/2021 08:32 AM ° ° Modules accepted: Orders ° °

## 2021-07-06 NOTE — Addendum Note (Signed)
Addended by: Trellis Paganini D on: 07/06/2021 08:32 AM   Modules accepted: Orders

## 2021-07-06 NOTE — Addendum Note (Signed)
Addended by: Electa Sterry D on: 07/06/2021 08:31 AM ° ° Modules accepted: Orders ° °

## 2021-07-06 NOTE — Addendum Note (Signed)
Addended by: Marly Schuld D on: 07/06/2021 08:31 AM ° ° Modules accepted: Orders ° °

## 2021-07-06 NOTE — Addendum Note (Signed)
Addended by: Alvina Chou on: 07/06/2021 01:18 PM   Modules accepted: Orders

## 2021-07-07 LAB — COMPREHENSIVE METABOLIC PANEL
ALT: 26 IU/L (ref 0–32)
AST: 29 IU/L (ref 0–40)
Albumin/Globulin Ratio: 1.4 (ref 1.2–2.2)
Albumin: 4.2 g/dL (ref 3.8–4.8)
Alkaline Phosphatase: 132 IU/L — ABNORMAL HIGH (ref 44–121)
BUN/Creatinine Ratio: 18 (ref 12–28)
BUN: 14 mg/dL (ref 8–27)
Bilirubin Total: <0.2 mg/dL (ref 0.0–1.2)
CO2: 21 mmol/L (ref 20–29)
Calcium: 9.2 mg/dL (ref 8.7–10.3)
Chloride: 105 mmol/L (ref 96–106)
Creatinine, Ser: 0.78 mg/dL (ref 0.57–1.00)
Globulin, Total: 2.9 g/dL (ref 1.5–4.5)
Glucose: 94 mg/dL (ref 70–99)
Potassium: 5.1 mmol/L (ref 3.5–5.2)
Sodium: 144 mmol/L (ref 134–144)
Total Protein: 7.1 g/dL (ref 6.0–8.5)
eGFR: 85 mL/min/{1.73_m2} (ref >59)

## 2021-07-07 LAB — CBC WITH DIFFERENTIAL/PLATELET
Basophils Absolute: 0.1 10*3/uL (ref 0.0–0.2)
Basos: 2 %
EOS (ABSOLUTE): 0.2 10*3/uL (ref 0.0–0.4)
Eos: 4 %
Hematocrit: 42.2 % (ref 34.0–46.6)
Hemoglobin: 14.4 g/dL (ref 11.1–15.9)
Immature Grans (Abs): 0 10*3/uL (ref 0.0–0.1)
Immature Granulocytes: 0 %
Lymphocytes Absolute: 1.5 10*3/uL (ref 0.7–3.1)
Lymphs: 33 %
MCH: 31.3 pg (ref 26.6–33.0)
MCHC: 34.1 g/dL (ref 31.5–35.7)
MCV: 92 fL (ref 79–97)
Monocytes Absolute: 0.5 10*3/uL (ref 0.1–0.9)
Monocytes: 10 %
Neutrophils Absolute: 2.3 10*3/uL (ref 1.4–7.0)
Neutrophils: 51 %
Platelets: 319 10*3/uL (ref 150–450)
RBC: 4.6 x10E6/uL (ref 3.77–5.28)
RDW: 12.9 % (ref 11.7–15.4)
WBC: 4.6 10*3/uL (ref 3.4–10.8)

## 2021-07-07 LAB — LIPID PANEL
Chol/HDL Ratio: 3.6 ratio (ref 0.0–4.4)
Cholesterol, Total: 230 mg/dL — ABNORMAL HIGH (ref 100–199)
HDL: 64 mg/dL (ref >39)
LDL Chol Calc (NIH): 152 mg/dL — ABNORMAL HIGH (ref 0–99)
Triglycerides: 78 mg/dL (ref 0–149)
VLDL Cholesterol Cal: 14 mg/dL (ref 5–40)

## 2021-07-07 LAB — TSH: TSH: 2.17 u[IU]/mL (ref 0.450–4.500)

## 2021-07-07 LAB — HEPATIC FUNCTION PANEL: Bilirubin, Direct: <0.1 mg/dL (ref 0.00–0.40)

## 2022-04-29 ENCOUNTER — Other Ambulatory Visit: Payer: Self-pay | Admitting: Family Medicine

## 2022-04-29 DIAGNOSIS — Z1231 Encounter for screening mammogram for malignant neoplasm of breast: Secondary | ICD-10-CM

## 2022-05-04 IMAGING — MG DIGITAL SCREENING BILAT W/ TOMO W/ CAD
8 series · 8 of 24 positions shown · non-contrast
Comparison: Previous exam(s).

CLINICAL DATA: Screening.

EXAM:
DIGITAL SCREENING BILATERAL MAMMOGRAM WITH TOMO AND CAD

[L MLO synth-2D]
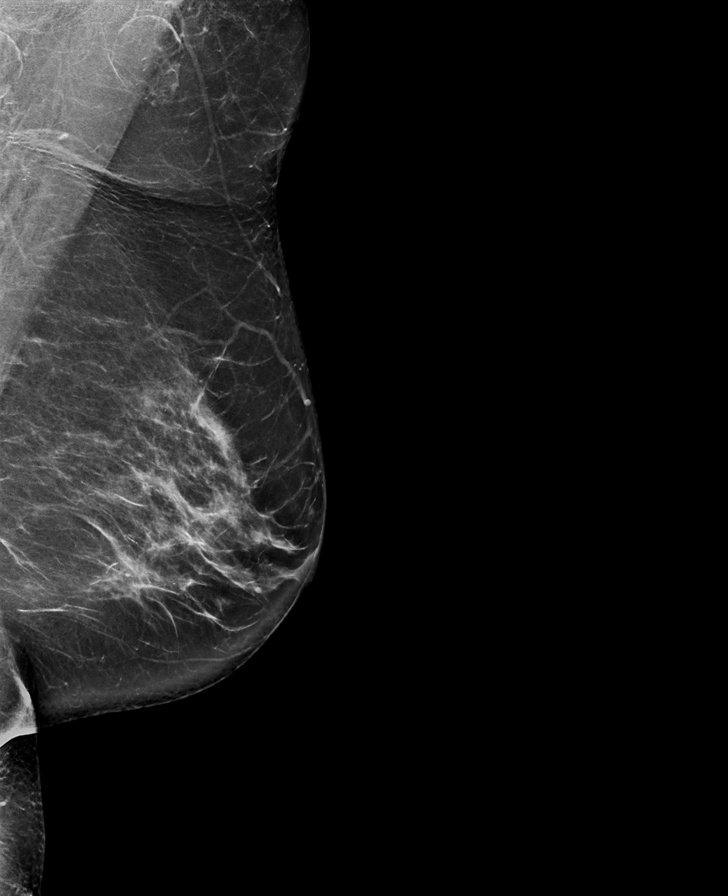

[R MLO synth-2D]
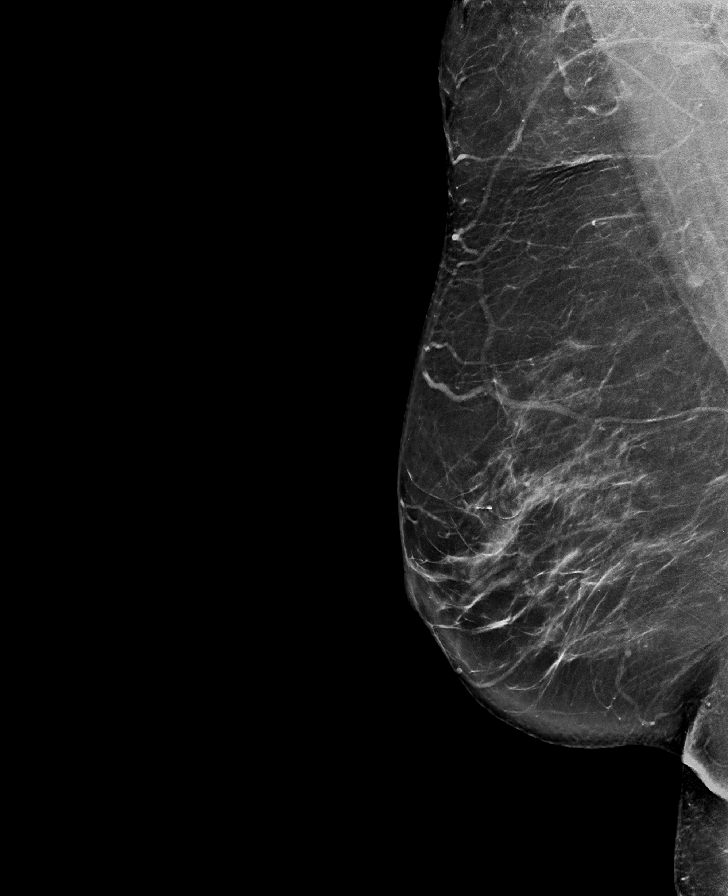

[R CC synth-2D]
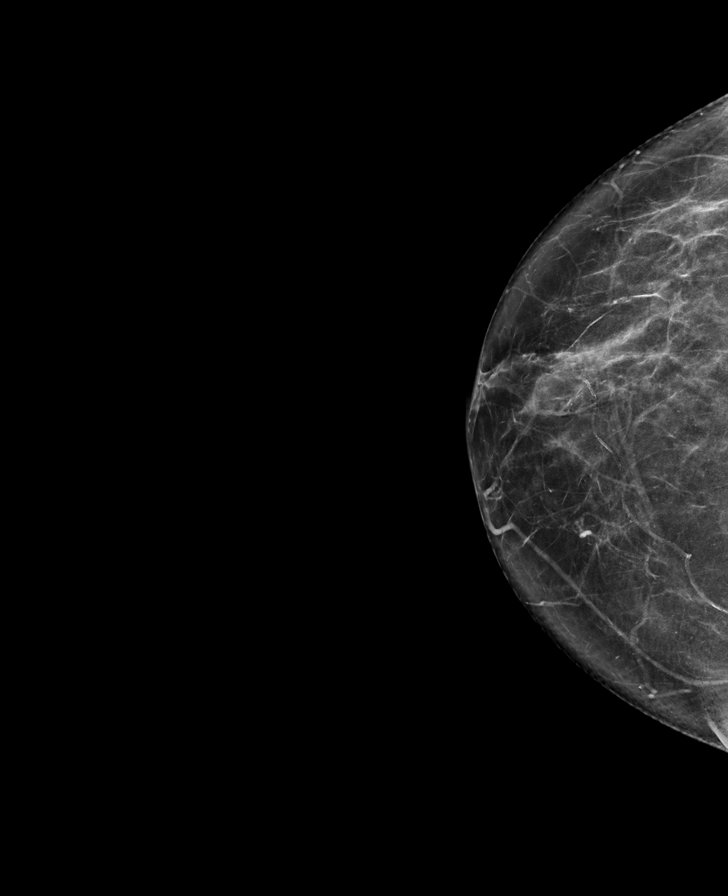

[L CC synth-2D]
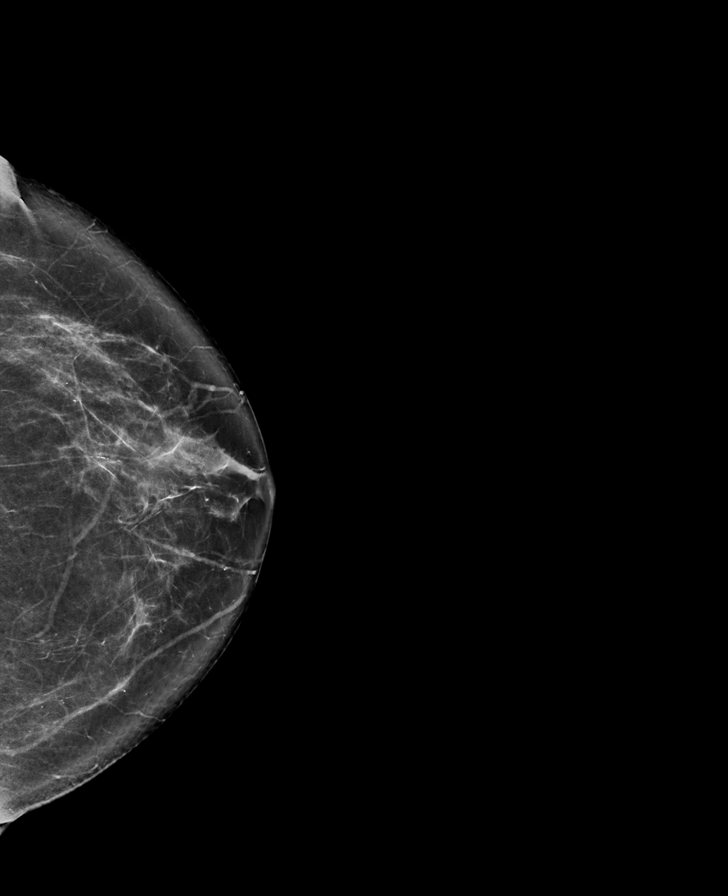

[L MLO tomo · tomo slice 41/81.0]
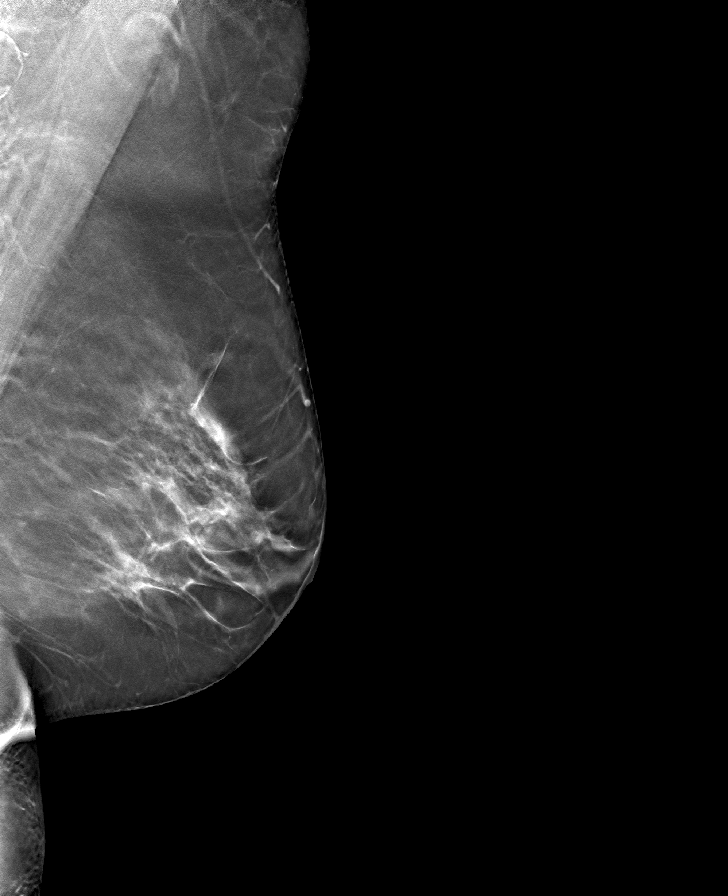

[R MLO tomo · tomo slice 43/85.0]
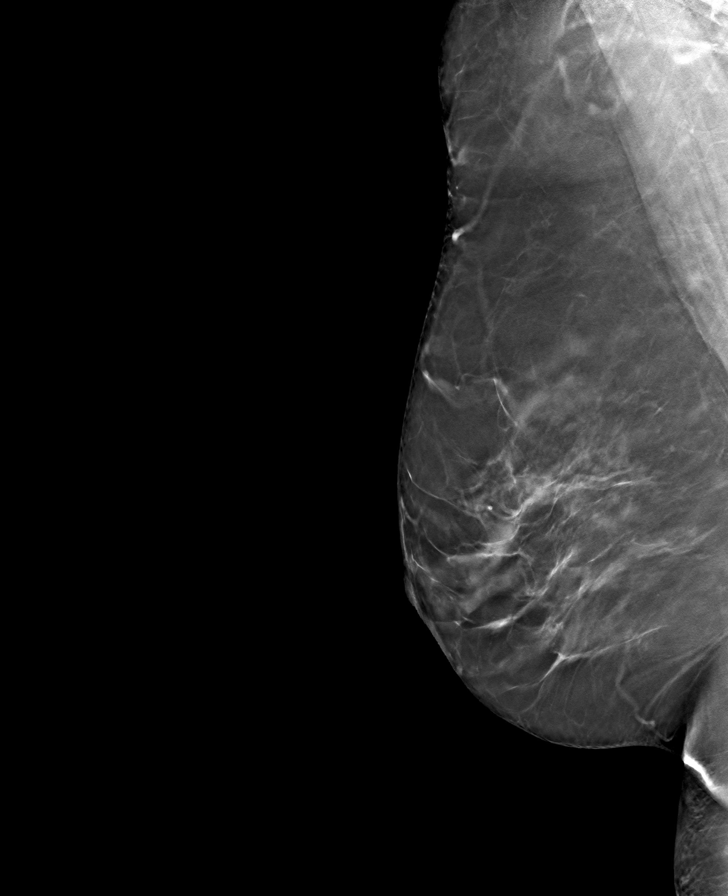

[L CC tomo · tomo slice 42/83.0]
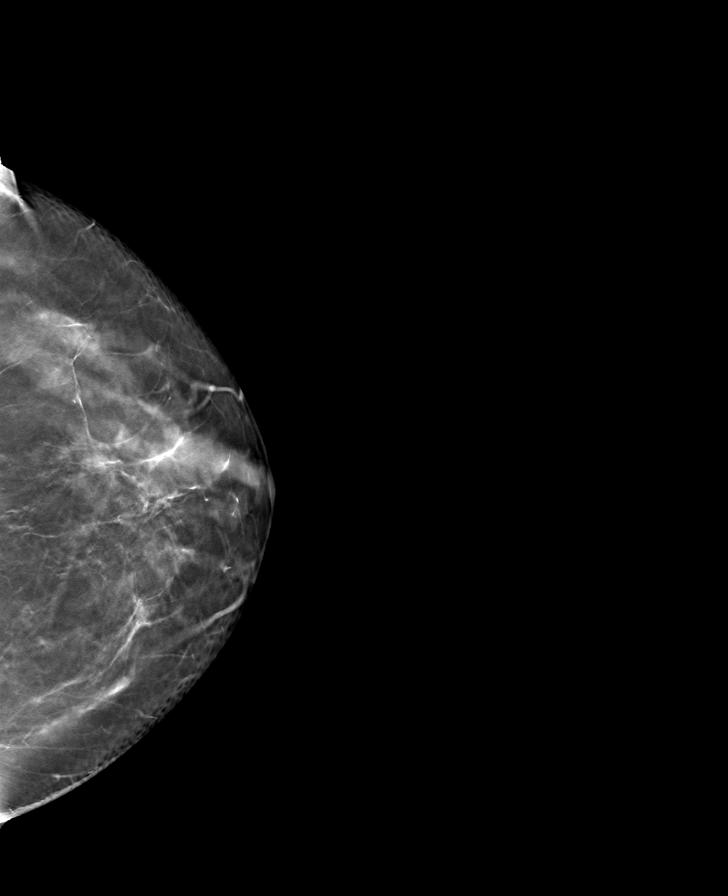

[R CC tomo · tomo slice 37/72.0]
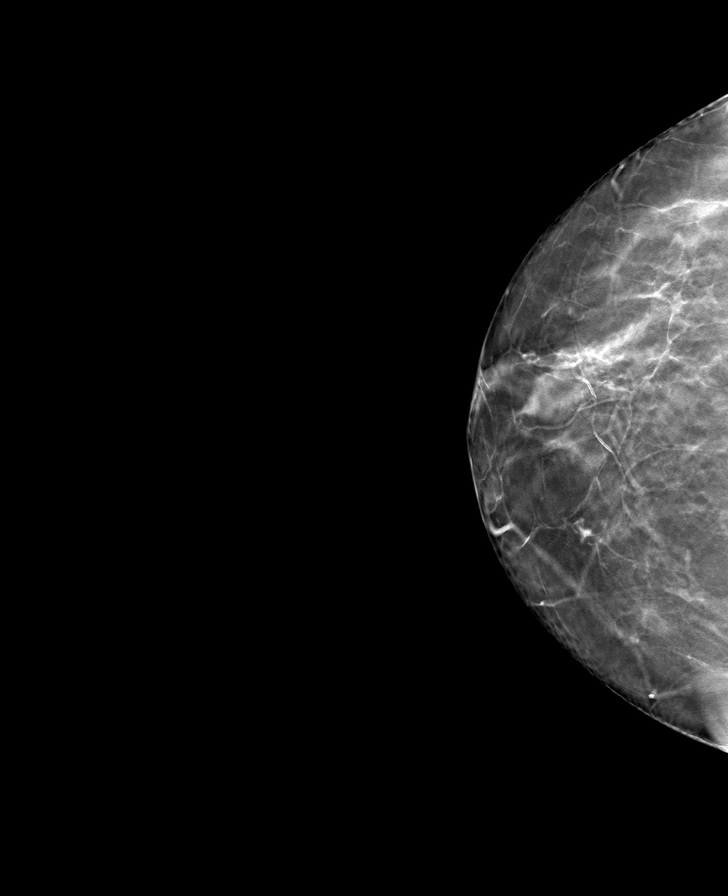

[8 of 24 positions shown; findings below may reference images not displayed]

ACR Breast Density Category b: There are scattered areas of
fibroglandular density.
FINDINGS: There are no findings suspicious for malignancy. Images were
processed with CAD.
IMPRESSION: No mammographic evidence of malignancy. A result letter of this
screening mammogram will be mailed directly to the patient.

RECOMMENDATION:
Screening mammogram in one year. (Code:[id number]-775)

BI-RADS CATEGORY  1: Negative.

## 2022-05-15 ENCOUNTER — Encounter: Payer: Managed Care, Other (non HMO) | Admitting: Family Medicine

## 2022-05-31 ENCOUNTER — Ambulatory Visit
Admission: RE | Admit: 2022-05-31 | Discharge: 2022-05-31 | Disposition: A | Discharge status: Home / Self Care | Payer: Medicare Other | Source: Ambulatory Visit | Attending: Urgent Care | Admitting: Urgent Care

## 2022-05-31 VITALS — BP 136/84 | HR 82 | Temp 97.7°F | Resp 17

## 2022-05-31 DIAGNOSIS — S93401A Sprain of unspecified ligament of right ankle, initial encounter: Secondary | ICD-10-CM | POA: Diagnosis not present

## 2022-05-31 NOTE — ED Provider Notes (Signed)
Roderic Palau    CSN: [id number] Arrival date & time: 05/31/22  1411      History   Chief Complaint Chief Complaint  Patient presents with   Ankle Pain    Possible sprained ankle - Entered by patient    HPI Annette Hess is a 67 y.o. female.   HPI  This urgent care with complaint of "spraining" her right ankle after falling down 4 steps yesterday.  She endorses limited range of motion, swelling, pain at the site.    Past Medical History:  Diagnosis Date   Gestational diabetes     Patient Active Problem List   Diagnosis Date Noted   Elevated liver enzymes 07/01/2021   Left hand pain 05/14/2021   Hyperlipidemia 02/17/2017   Encounter for routine gynecological examination 02/16/2016   Alkaline phosphatase raised 02/08/2015   Routine general medical examination at a health care facility 01/10/2012   Routine gynecological examination 01/10/2012   Colon cancer screening 01/10/2012   GESTATIONAL DIABETES 12/15/2006    Past Surgical History:  Procedure Laterality Date   COLONOSCOPY WITH PROPOFOL N/A 06/27/2021   Procedure: COLONOSCOPY WITH PROPOFOL;  Surgeon: Lin Landsman, MD;  Location: [place];  Service: Gastroenterology;  Laterality: N/A;   LAPAROSCOPY     infertility    OB History   No obstetric history on file.      Home Medications    Prior to Admission medications   Not on File    Family History Family History  Problem Relation Age of Onset   Heart defect Father        ablation   Cancer Mother        liver (deceased)   Diabetes Mother    Cancer Maternal Grandmother        uterine   Cancer Paternal Grandmother        breast    Breast cancer Paternal Grandmother 40   Anorexia nervosa Daughter     Social History Social History   Tobacco Use   Smoking status: Never   Smokeless tobacco: Never  Substance Use Topics   Alcohol use: Yes    Alcohol/week: 0.0 standard drinks of alcohol    Comment: wine-occ (once a  week)   Drug use: No     Allergies   Patient has no known allergies.   Review of Systems Review of Systems   Physical Exam Triage Vital Signs ED Triage Vitals  Enc Vitals Group     BP 05/31/22 1452 136/84     Pulse Rate 05/31/22 1452 82     Resp 05/31/22 1452 17     Temp 05/31/22 1452 97.7 F (36.5 C)     Temp src --      SpO2 05/31/22 1452 98 %     Weight --      Height --      Head Circumference --      Peak Flow --      Pain Score 05/31/22 1453 0     Pain Loc --      Pain Edu? --      Excl. in [place]? --    No data found.  Updated Vital Signs BP 136/84   Pulse 82   Temp 97.7 F (36.5 C)   Resp 17   SpO2 98%   Visual Acuity Right Eye Distance:   Left Eye Distance:   Bilateral Distance:    Right Eye Near:   Left Eye Near:  Bilateral Near:     Physical Exam   UC Treatments / Results  Labs (all labs ordered are listed, but only abnormal results are displayed) Labs Reviewed - No data to display  EKG   Radiology No results found.  Procedures Procedures (including critical care time)  Medications Ordered in UC Medications - No data to display  Initial Impression / Assessment and Plan / UC Course  I have reviewed the triage vital signs and the nursing notes.  Pertinent labs & imaging results that were available during my care of the patient were reviewed by me and considered in my medical decision making (see chart for details).   [place] Ankle Rule from [email or web address]  on 05/31/2022 ** All calculations should be rechecked by clinician prior to use **  RESULT SUMMARY: Ruled out  Foot x-ray series not indicated.   INPUTS: Location of pain --> 1 = Midfoot Bone tenderness at C --> 0 = No Bone tenderness at D --> 0 = No Inability to bear weight both immediately after injury AND in ED --> 0 = No  Discussed [place] rules with patient and after shared decision-making agreed to forego x-rays.  Recommended and provided ICE protocol with gentle  stretching.  Applied lace up and Velcro brace to provide stability.   Final Clinical Impressions(s) / UC Diagnoses   Final diagnoses:  None   Discharge Instructions   None    ED Prescriptions   None    PDMP not reviewed this encounter.   Rose Phi, [place] 05/31/22 1525

## 2022-05-31 NOTE — Discharge Instructions (Addendum)
Apply cold therapy throughout the day, compression, elevation. Avoid walking. Use gentle stretching while applying cold. May use NSAID medication for pain control as needed.

## 2022-05-31 NOTE — ED Triage Notes (Signed)
Pt. presents to UC w/ c/o of spraining her right ankle after falling down 4 steps yesterday. Pt. Endorses limited ROM, swelling and pain @ the site.

## 2022-06-05 ENCOUNTER — Other Ambulatory Visit: Payer: Medicare Other

## 2022-06-06 ENCOUNTER — Ambulatory Visit
Admission: RE | Admit: 2022-06-06 | Discharge: 2022-06-06 | Disposition: A | Discharge status: Home / Self Care | Payer: Medicare Other | Source: Ambulatory Visit | Attending: Family Medicine | Admitting: Family Medicine

## 2022-06-06 DIAGNOSIS — Z1231 Encounter for screening mammogram for malignant neoplasm of breast: Secondary | ICD-10-CM | POA: Diagnosis not present

## 2022-06-12 ENCOUNTER — Encounter: Payer: Managed Care, Other (non HMO) | Admitting: Family Medicine

## 2022-07-23 ENCOUNTER — Telehealth: Payer: Self-pay | Admitting: Family Medicine

## 2022-07-23 DIAGNOSIS — E7800 Pure hypercholesterolemia, unspecified: Secondary | ICD-10-CM

## 2022-07-23 DIAGNOSIS — R748 Abnormal levels of other serum enzymes: Secondary | ICD-10-CM

## 2022-07-23 DIAGNOSIS — Z0000 Encounter for general adult medical examination without abnormal findings: Secondary | ICD-10-CM

## 2022-07-23 NOTE — Telephone Encounter (Signed)
-----   Message from Velna Hatchet, RT sent at 07/11/2022 10:28 AM EST ----- Regarding: Wed 2/28 lab Patient is scheduled for cpx, please order future labs.  Thanks, Anda Kraft

## 2022-07-24 ENCOUNTER — Other Ambulatory Visit (INDEPENDENT_AMBULATORY_CARE_PROVIDER_SITE_OTHER): Payer: Medicare Other

## 2022-07-24 DIAGNOSIS — E7800 Pure hypercholesterolemia, unspecified: Secondary | ICD-10-CM | POA: Diagnosis not present

## 2022-07-24 DIAGNOSIS — R748 Abnormal levels of other serum enzymes: Secondary | ICD-10-CM | POA: Diagnosis not present

## 2022-07-24 NOTE — Addendum Note (Signed)
Addended by: Ellamae Sia on: 07/24/2022 08:12 AM   Modules accepted: Orders

## 2022-07-25 LAB — COMPREHENSIVE METABOLIC PANEL
ALT: 18 IU/L (ref 0–32)
AST: 21 IU/L (ref 0–40)
Albumin/Globulin Ratio: 1.6 (ref 1.2–2.2)
Albumin: 4.4 g/dL (ref 3.9–4.9)
Alkaline Phosphatase: 141 IU/L — ABNORMAL HIGH (ref 44–121)
BUN/Creatinine Ratio: 21 (ref 12–28)
BUN: 17 mg/dL (ref 8–27)
Bilirubin Total: 0.3 mg/dL (ref 0.0–1.2)
CO2: 22 mmol/L (ref 20–29)
Calcium: 9.9 mg/dL (ref 8.7–10.3)
Chloride: 107 mmol/L — ABNORMAL HIGH (ref 96–106)
Creatinine, Ser: 0.8 mg/dL (ref 0.57–1.00)
Globulin, Total: 2.7 g/dL (ref 1.5–4.5)
Glucose: 90 mg/dL (ref 70–99)
Potassium: 4.9 mmol/L (ref 3.5–5.2)
Sodium: 145 mmol/L — ABNORMAL HIGH (ref 134–144)
Total Protein: 7.1 g/dL (ref 6.0–8.5)
eGFR: 82 mL/min/{1.73_m2} (ref >59)

## 2022-07-25 LAB — CBC WITH DIFFERENTIAL/PLATELET
Basophils Absolute: 0.1 10*3/uL (ref 0.0–0.2)
Basos: 2 %
EOS (ABSOLUTE): 0.2 10*3/uL (ref 0.0–0.4)
Eos: 4 %
Hematocrit: 44.4 % (ref 34.0–46.6)
Hemoglobin: 14.9 g/dL (ref 11.1–15.9)
Immature Grans (Abs): 0 10*3/uL (ref 0.0–0.1)
Immature Granulocytes: 0 %
Lymphocytes Absolute: 1.4 10*3/uL (ref 0.7–3.1)
Lymphs: 33 %
MCH: 31.2 pg (ref 26.6–33.0)
MCHC: 33.6 g/dL (ref 31.5–35.7)
MCV: 93 fL (ref 79–97)
Monocytes Absolute: 0.4 10*3/uL (ref 0.1–0.9)
Monocytes: 8 %
Neutrophils Absolute: 2.3 10*3/uL (ref 1.4–7.0)
Neutrophils: 53 %
Platelets: 329 10*3/uL (ref 150–450)
RBC: 4.78 x10E6/uL (ref 3.77–5.28)
RDW: 12.4 % (ref 11.7–15.4)
WBC: 4.3 10*3/uL (ref 3.4–10.8)

## 2022-07-25 LAB — LIPID PANEL
Chol/HDL Ratio: 3.5 ratio (ref 0.0–4.4)
Cholesterol, Total: 234 mg/dL — ABNORMAL HIGH (ref 100–199)
HDL: 67 mg/dL (ref >39)
LDL Chol Calc (NIH): 153 mg/dL — ABNORMAL HIGH (ref 0–99)
Triglycerides: 79 mg/dL (ref 0–149)
VLDL Cholesterol Cal: 14 mg/dL (ref 5–40)

## 2022-07-25 LAB — TSH: TSH: 2.34 u[IU]/mL (ref 0.450–4.500)

## 2022-07-31 ENCOUNTER — Ambulatory Visit (INDEPENDENT_AMBULATORY_CARE_PROVIDER_SITE_OTHER): Payer: Medicare Other | Admitting: Family Medicine

## 2022-07-31 ENCOUNTER — Encounter: Payer: Self-pay | Admitting: Family Medicine

## 2022-07-31 VITALS — BP 128/82 | HR 68 | Temp 97.1°F | Ht 66.0 in | Wt 183.0 lb

## 2022-07-31 DIAGNOSIS — E2839 Other primary ovarian failure: Secondary | ICD-10-CM | POA: Insufficient documentation

## 2022-07-31 DIAGNOSIS — R748 Abnormal levels of other serum enzymes: Secondary | ICD-10-CM

## 2022-07-31 DIAGNOSIS — Z23 Encounter for immunization: Secondary | ICD-10-CM

## 2022-07-31 DIAGNOSIS — Z0000 Encounter for general adult medical examination without abnormal findings: Secondary | ICD-10-CM | POA: Insufficient documentation

## 2022-07-31 DIAGNOSIS — Z1211 Encounter for screening for malignant neoplasm of colon: Secondary | ICD-10-CM

## 2022-07-31 DIAGNOSIS — E7800 Pure hypercholesterolemia, unspecified: Secondary | ICD-10-CM | POA: Diagnosis not present

## 2022-07-31 NOTE — Assessment & Plan Note (Signed)
Reviewed health habits including diet and exercise and skin cancer prevention Reviewed appropriate screening tests for age  Also reviewed health mt list, fam hx and immunization status , as well as social and family history   See HPI Labs reviewed Colonoscopy utd 06/2021 Mammogram utd 05/2021 Prevnar 20 vaccine given Dexa ordered/disc fall prev , no fx , taking ca and D Enc to add strength training  Adv directive materials given (needs living will) to work on No cognitive concerns /disc brain health  Nl hearing/vision screen Nl PHQ No help needed with ADLs , functionality is excellent Rev care team

## 2022-07-31 NOTE — Assessment & Plan Note (Signed)
Colonoscopy utd 06/2021

## 2022-07-31 NOTE — Assessment & Plan Note (Signed)
Screening dexa ordered

## 2022-07-31 NOTE — Assessment & Plan Note (Signed)
Disc goals for lipids and reasons to control them Rev last labs with pt Rev low sat fat diet in detail Declines treatment or any type Ascvd risk 5.6%

## 2022-07-31 NOTE — Progress Notes (Signed)
Hearing Screening   '500Hz'$  '1000Hz'$  '2000Hz'$  '4000Hz'$   Right ear '20 20 20 20  '$ Left ear '20 20 20 20   '$ Vision Screening   Right eye Left eye Both eyes  Without correction     With correction 20/20 20/20 20/20

## 2022-07-31 NOTE — Patient Instructions (Addendum)
Prevnar 20 vaccine today   Add some strength training is important  Keep walking also   Call and schedule your bone density test at [facility] at the living will/poa and get it notarized   For cholesterol Avoid red meat/ fried foods/ egg yolks/ fatty breakfast meats/ butter, cheese and high fat dairy/ and shellfish    Please call the location of your choice from the menu below to schedule your Mammogram and/or Bone Density appointment.    [facility]                      Phone:  [phone], [place] [address]                                                             Services: Traditional and 3D Mammogram, [facility]                 Phone: [phone] [address], [place] [address]    Service: Bone Density ONLY   *this site does NOT perform mammograms  [place]                        Phone:  [phone] [address], [place] [address]                                            Services:  3D Mammogram and [place] at [facility]   Phone:  [phone]   [place], [place] [address]                                            Services: 3D Mammogram and Bone Density  [place] [facility]  [place] at [facility])  Phone:  [phone]   [address] Room [place], [place] [address]                                              Services:  3D Mammogram and Bone Density

## 2022-07-31 NOTE — Progress Notes (Signed)
Subjective:    Patient ID: Annette Hess, female    DOB: Apr 18, 1956, 66 y.o.   MRN: [id number]  HPI Pt presents for welcome to medicare visit   I have personally reviewed the Medicare Annual Wellness questionnaire and have noted 1. The patient's medical and social history 2. Their use of alcohol, tobacco or illicit drugs 3. Their current medications and supplements 4. The patient's functional ability including ADL's, fall risks, home safety risks and hearing or visual             impairment. 5. Diet and physical activities 6. Evidence for depression or mood disorders  The patients weight, height, BMI have been recorded in the chart and visual acuity is per eye clinic.  I have made referrals, counseling and provided education to the patient based review of the above and I have provided the pt with a written personalized care plan for preventive services. Reviewed and updated provider list, see scanned forms.  See scanned forms.  Routine anticipatory guidance given to patient.  See health maintenance. Colon cancer screening  ifob 04/2020 negative , colonoscopy 06/2021  Breast cancer screening  mammogram 05/2022 Self breast exam: no lumps  Flu vaccine 03/2022 Tetanus vaccine 11/2017 Pneumovax - needs prevnar 20  Zoster vaccine- has not had /considering this in pharmacy Dexa : wants to schedule  Falls: one  - tripped 1/2 way down steps / twisted ankle  Fractures: none Supplements taking ca and D Exercise - walking / pickle ball essons  Hep C screening 2008    Advance directive: has that utd  Cognitive function addressed- see scanned forms- and if abnormal then additional documentation follows.  No new changes  Active brain  Reads/socializes   PMH and SH reviewed  Meds, vitals, and allergies reviewed.   ROS: See HPI.  Otherwise negative.    Weight :  Wt Readings from Last 3 Encounters:  07/31/22 183 lb (83 kg)  06/27/21 189 lb (85.7 kg)  05/14/21 189 lb 8 oz (86 kg)    29.54 kg/m Wt is down a bit  Working on it  Could eat better   Hearing/vision: Hearing Screening   '500Hz'$  '1000Hz'$  '2000Hz'$  '4000Hz'$   Right ear '20 20 20 20  '$ Left ear '20 20 20 20   '$ Vision Screening   Right eye Left eye Both eyes  Without correction     With correction '20/20 20/20 20/20 '$      PHQ:    07/31/2022    8:11 AM 05/14/2021    9:35 AM 04/19/2020   10:03 AM 04/19/2019    8:58 AM 04/13/2018    9:00 AM  Depression screen PHQ 2/9  Decreased Interest 0 1 0 0 0  Down, Depressed, Hopeless 0 1 0 1 0  PHQ - 2 Score 0 2 0 1 0  Altered sleeping  '3 3 3 3  '$ Tired, decreased energy  1 0 0 0  Change in appetite  '2 2 2 1  '$ Feeling bad or failure about yourself   1 0 0 0  Trouble concentrating  0 0 0 0  Moving slowly or fidgety/restless  0 0 0 0  Suicidal thoughts  0 0 0 0  PHQ-9 Score  '9 5 6 4  '$ Difficult doing work/chores   Not difficult at all       ADLs: no problems   Functionality: excellent   Care team : Vada Yellen- pcp [facility]- GI   Fam hx- mother had liver cancer PGM breast cancer  at 90  BP Readings from Last 3 Encounters:  07/31/22 128/82  05/31/22 136/84  06/27/21 131/67   Pulse Readings from Last 3 Encounters:  07/31/22 68  05/31/22 82  06/27/21 74    Lab Results  Component Value Date   CREATININE 0.80 07/24/2022   BUN 17 07/24/2022   NA 145 (H) 07/24/2022   K 4.9 07/24/2022   CL 107 (H) 07/24/2022   CO2 22 07/24/2022   Lab Results  Component Value Date   ALT 18 07/24/2022   AST 21 07/24/2022   ALKPHOS 141 (H) 07/24/2022   BILITOT 0.3 07/24/2022   H/o chronic alk phos elevation  Lab Results  Component Value Date   WBC 4.3 07/24/2022   HGB 14.9 07/24/2022   HCT 44.4 07/24/2022   MCV 93 07/24/2022   PLT 329 07/24/2022   Lab Results  Component Value Date   TSH 2.340 07/24/2022   Glucose 90   Hyperlipidemia Lab Results  Component Value Date   CHOL 234 (H) 07/24/2022   CHOL 230 (H) 07/06/2021   CHOL 226 (H) 05/14/2021   Lab Results   Component Value Date   HDL 67 07/24/2022   HDL 64 07/06/2021   HDL 55 05/14/2021   Lab Results  Component Value Date   LDLCALC 153 (H) 07/24/2022   LDLCALC 152 (H) 07/06/2021   LDLCALC 151 (H) 05/14/2021   Lab Results  Component Value Date   TRIG 79 07/24/2022   TRIG 78 07/06/2021   TRIG 112 05/14/2021   Lab Results  Component Value Date   CHOLHDL 3.5 07/24/2022   CHOLHDL 3.6 07/06/2021   CHOLHDL 4.1 05/14/2021   No results found for: "LDLDIRECT"  Diet controlled  Diet not perfect   Declines medicine   The 10-year ASCVD risk score (Arnett DK, et al., 2019) is: 5.6%   Values used to calculate the score:     Age: 36 years     Sex: Female     Is Non-Hispanic African American: No     Diabetic: No     Tobacco smoker: No     Systolic Blood Pressure: 0000000 mmHg     Is BP treated: No     HDL Cholesterol: 67 mg/dL     Total Cholesterol: 234 mg/dL  EKG NSR with short pr interval    Patient Active Problem List   Diagnosis Date Noted   Welcome to Medicare preventive visit 07/31/2022   Estrogen deficiency 07/31/2022   Hyperlipidemia 02/17/2017   Alkaline phosphatase raised 02/08/2015   Routine general medical examination at a health care facility 01/10/2012   Colon cancer screening 01/10/2012   GESTATIONAL DIABETES 12/15/2006   Past Medical History:  Diagnosis Date   Gestational diabetes    Past Surgical History:  Procedure Laterality Date   COLONOSCOPY WITH PROPOFOL N/A 06/27/2021   Procedure: COLONOSCOPY WITH PROPOFOL;  Surgeon: Lin Landsman, MD;  Location: [facility] END[facility]COPY;  Service: Gastroenterology;  Laterality: N/A;   LAPAROSCOPY     infertility   Social History   Tobacco Use   Smoking status: Never    Passive exposure: Past   Smokeless tobacco: Never  Vaping Use   Vaping Use: Never used  Substance Use Topics   Alcohol use: Yes    Alcohol/week: 0.0 standard drinks of alcohol    Comment: wine-occ (once a week)   Drug use: No   Family History   Problem Relation Age of Onset   Heart defect Father  ablation   Cancer Mother        liver (deceased)   Diabetes Mother    Cancer Maternal Grandmother        uterine   Cancer Paternal Grandmother        breast    Breast cancer Paternal Grandmother 35   Anorexia nervosa Daughter    No Known Allergies Current Outpatient Medications on File Prior to Visit  Medication Sig Dispense Refill   calcium carbonate (OSCAL) 1500 (600 Ca) MG TABS tablet Take by mouth daily with breakfast.     Multiple Vitamin (MULTIVITAMIN) tablet Take 1 tablet by mouth daily.     No current facility-administered medications on file prior to visit.       Review of Systems  Constitutional:  Negative for activity change, appetite change, fatigue, fever and unexpected weight change.  HENT:  Negative for congestion, ear pain, rhinorrhea, sinus pressure and sore throat.   Eyes:  Negative for pain, redness and visual disturbance.  Respiratory:  Negative for cough, shortness of breath and wheezing.   Cardiovascular:  Negative for chest pain and palpitations.  Gastrointestinal:  Negative for abdominal pain, blood in stool, constipation and diarrhea.  Endocrine: Negative for polydipsia and polyuria.  Genitourinary:  Negative for dysuria, frequency and urgency.  Musculoskeletal:  Positive for arthralgias. Negative for back pain and myalgias.  Skin:  Negative for pallor and rash.  Allergic/Immunologic: Negative for environmental allergies.  Neurological:  Negative for dizziness, syncope and headaches.  Hematological:  Negative for adenopathy. Does not bruise/bleed easily.  Psychiatric/Behavioral:  Negative for decreased concentration and dysphoric mood. The patient is not nervous/anxious.        Objective:   Physical Exam Constitutional:      General: She is not in acute distress.    Appearance: Normal appearance. She is well-developed. She is obese. She is not ill-appearing or diaphoretic.  HENT:      Head: Normocephalic and atraumatic.     Right Ear: Tympanic membrane, ear canal and external ear normal.     Left Ear: Tympanic membrane, ear canal and external ear normal.     Nose: Nose normal. No congestion.     Mouth/Throat:     Mouth: Mucous membranes are moist.     Pharynx: Oropharynx is clear. No posterior oropharyngeal erythema.  Eyes:     General: No scleral icterus.    Extraocular Movements: Extraocular movements intact.     Conjunctiva/sclera: Conjunctivae normal.     Pupils: Pupils are equal, round, and reactive to light.  Neck:     Thyroid: No thyromegaly.     Vascular: No carotid bruit or JVD.  Cardiovascular:     Rate and Rhythm: Normal rate and regular rhythm.     Pulses: Normal pulses.     Heart sounds: Normal heart sounds.     No gallop.  Pulmonary:     Effort: Pulmonary effort is normal. No respiratory distress.     Breath sounds: Normal breath sounds. No wheezing.     Comments: Good air exch Chest:     Chest wall: No tenderness.  Abdominal:     General: Bowel sounds are normal. There is no distension or abdominal bruit.     Palpations: Abdomen is soft. There is no mass.     Tenderness: There is no abdominal tenderness.     Hernia: No hernia is present.  Genitourinary:    Comments: Breast exam: No mass, nodules, thickening, tenderness, bulging, retraction, inflamation, nipple discharge or  skin changes noted.  No axillary or clavicular LA.     Musculoskeletal:        General: No tenderness. Normal range of motion.     Cervical back: Normal range of motion and neck supple. No rigidity. No muscular tenderness.     Right lower leg: No edema.     Left lower leg: No edema.     Comments: No kyphosis   Lymphadenopathy:     Cervical: No cervical adenopathy.  Skin:    General: Skin is warm and dry.     Coloration: Skin is not pale.     Findings: No erythema or rash.     Comments: Solar lentigines diffusely   Neurological:     Mental Status: She is alert.  Mental status is at baseline.     Cranial Nerves: No cranial nerve deficit.     Motor: No abnormal muscle tone.     Coordination: Coordination normal.     Gait: Gait normal.     Deep Tendon Reflexes: Reflexes are normal and symmetric. Reflexes normal.  Psychiatric:        Attention and Perception: Attention normal.        Mood and Affect: Mood normal.        Cognition and Memory: Cognition and memory normal.           Assessment & Plan:   Problem List Items Addressed This Visit       Other   Alkaline phosphatase raised    Chronic  Labs rev      Colon cancer screening    Colonoscopy utd 06/2021      Estrogen deficiency    Screening dexa ordered        Relevant Orders   DG Bone Density   Hyperlipidemia    Disc goals for lipids and reasons to control them Rev last labs with pt Rev low sat fat diet in detail Declines treatment or any type Ascvd risk 5.6%       Welcome to Medicare preventive visit - Primary    Reviewed health habits including diet and exercise and skin cancer prevention Reviewed appropriate screening tests for age  Also reviewed health mt list, fam hx and immunization status , as well as social and family history   See HPI Labs reviewed Colonoscopy utd 06/2021 Mammogram utd 05/2021 Prevnar 20 vaccine given Dexa ordered/disc fall prev , no fx , taking ca and D Enc to add strength training  Adv directive materials given (needs living will) to work on No cognitive concerns /disc brain health  Nl hearing/vision screen Nl PHQ No help needed with ADLs , functionality is excellent Rev care team         Relevant Orders   EKG 12-Lead (Completed)   Other Visit Diagnoses     Need for vaccination against Streptococcus pneumoniae       Relevant Orders   Pneumococcal conjugate vaccine 20-valent (Prevnar 20) (Completed)

## 2022-07-31 NOTE — Assessment & Plan Note (Signed)
Chronic  Labs rev

## 2022-09-26 ENCOUNTER — Ambulatory Visit
Admission: RE | Admit: 2022-09-26 | Discharge: 2022-09-26 | Disposition: A | Discharge status: Home / Self Care | Payer: Medicare Other | Source: Ambulatory Visit | Attending: Family Medicine | Admitting: Family Medicine

## 2022-09-26 DIAGNOSIS — E2839 Other primary ovarian failure: Secondary | ICD-10-CM

## 2022-09-26 DIAGNOSIS — M810 Age-related osteoporosis without current pathological fracture: Secondary | ICD-10-CM | POA: Diagnosis not present

## 2023-05-05 DIAGNOSIS — K080 Exfoliation of teeth due to systemic causes: Secondary | ICD-10-CM | POA: Diagnosis not present

## 2023-06-02 DIAGNOSIS — K080 Exfoliation of teeth due to systemic causes: Secondary | ICD-10-CM | POA: Diagnosis not present

## 2023-07-21 ENCOUNTER — Other Ambulatory Visit: Payer: Self-pay | Admitting: Family Medicine

## 2023-07-21 DIAGNOSIS — Z1231 Encounter for screening mammogram for malignant neoplasm of breast: Secondary | ICD-10-CM

## 2023-07-28 ENCOUNTER — Telehealth: Payer: Self-pay | Admitting: Family Medicine

## 2023-07-28 DIAGNOSIS — R7303 Prediabetes: Secondary | ICD-10-CM | POA: Insufficient documentation

## 2023-07-28 DIAGNOSIS — R748 Abnormal levels of other serum enzymes: Secondary | ICD-10-CM

## 2023-07-28 DIAGNOSIS — Z131 Encounter for screening for diabetes mellitus: Secondary | ICD-10-CM | POA: Insufficient documentation

## 2023-07-28 DIAGNOSIS — E7800 Pure hypercholesterolemia, unspecified: Secondary | ICD-10-CM

## 2023-07-28 NOTE — Telephone Encounter (Signed)
-----   Message from Vincenza Hews sent at 07/21/2023  9:27 AM EST ----- Regarding: Labs Wed 07/30/23 Hello,  Patient is coming in for CPE labs on Wednesday 07/30/23. Can we get orders please.   Thanks

## 2023-07-30 ENCOUNTER — Other Ambulatory Visit: Payer: Medicare Other

## 2023-08-06 ENCOUNTER — Encounter: Payer: Medicare Other | Admitting: Family Medicine

## 2023-09-25 ENCOUNTER — Ambulatory Visit
Admission: RE | Admit: 2023-09-25 | Discharge: 2023-09-25 | Disposition: A | Discharge status: Home / Self Care | Payer: Medicare Other | Source: Ambulatory Visit | Attending: Family Medicine | Admitting: Family Medicine

## 2023-09-25 DIAGNOSIS — Z1231 Encounter for screening mammogram for malignant neoplasm of breast: Secondary | ICD-10-CM | POA: Insufficient documentation

## 2023-09-29 ENCOUNTER — Encounter: Payer: Self-pay | Admitting: Family Medicine

## 2023-10-07 ENCOUNTER — Other Ambulatory Visit: Payer: Medicare Other

## 2023-10-09 ENCOUNTER — Other Ambulatory Visit

## 2023-10-09 ENCOUNTER — Ambulatory Visit: Payer: Self-pay | Admitting: Family Medicine

## 2023-10-09 DIAGNOSIS — R748 Abnormal levels of other serum enzymes: Secondary | ICD-10-CM

## 2023-10-09 DIAGNOSIS — Z131 Encounter for screening for diabetes mellitus: Secondary | ICD-10-CM | POA: Diagnosis not present

## 2023-10-09 DIAGNOSIS — E7800 Pure hypercholesterolemia, unspecified: Secondary | ICD-10-CM

## 2023-10-09 LAB — COMPREHENSIVE METABOLIC PANEL WITH GFR
ALT: 29 U/L (ref 0–35)
AST: 22 U/L (ref 0–37)
Albumin: 4.3 g/dL (ref 3.5–5.2)
Alkaline Phosphatase: 107 U/L (ref 39–117)
BUN: 18 mg/dL (ref 6–23)
CO2: 27 meq/L (ref 19–32)
Calcium: 9.2 mg/dL (ref 8.4–10.5)
Chloride: 106 meq/L (ref 96–112)
Creatinine, Ser: 0.78 mg/dL (ref 0.40–1.20)
GFR: 78.82 mL/min (ref >60.00)
Glucose, Bld: 92 mg/dL (ref 70–99)
Potassium: 4.8 meq/L (ref 3.5–5.1)
Sodium: 141 meq/L (ref 135–145)
Total Bilirubin: 0.4 mg/dL (ref 0.2–1.2)
Total Protein: 7 g/dL (ref 6.0–8.3)

## 2023-10-09 LAB — LIPID PANEL
Cholesterol: 221 mg/dL — ABNORMAL HIGH (ref 0–200)
HDL: 53.4 mg/dL (ref >39.00)
LDL Cholesterol: 147 mg/dL — ABNORMAL HIGH (ref 0–99)
NonHDL: 167.12
Total CHOL/HDL Ratio: 4
Triglycerides: 100 mg/dL (ref 0.0–149.0)
VLDL: 20 mg/dL (ref 0.0–40.0)

## 2023-10-09 LAB — CBC WITH DIFFERENTIAL/PLATELET
Basophils Absolute: 0.1 10*3/uL (ref 0.0–0.1)
Basophils Relative: 2.1 % (ref 0.0–3.0)
Eosinophils Absolute: 0.3 10*3/uL (ref 0.0–0.7)
Eosinophils Relative: 4.8 % (ref 0.0–5.0)
HCT: 45 % (ref 36.0–46.0)
Hemoglobin: 15 g/dL (ref 12.0–15.0)
Lymphocytes Relative: 31.6 % (ref 12.0–46.0)
Lymphs Abs: 1.6 10*3/uL (ref 0.7–4.0)
MCHC: 33.3 g/dL (ref 30.0–36.0)
MCV: 93.3 fl (ref 78.0–100.0)
Monocytes Absolute: 0.6 10*3/uL (ref 0.1–1.0)
Monocytes Relative: 11.9 % (ref 3.0–12.0)
Neutro Abs: 2.6 10*3/uL (ref 1.4–7.7)
Neutrophils Relative %: 49.6 % (ref 43.0–77.0)
Platelets: 368 10*3/uL (ref 150.0–400.0)
RBC: 4.82 Mil/uL (ref 3.87–5.11)
RDW: 13.3 % (ref 11.5–15.5)
WBC: 5.2 10*3/uL (ref 4.0–10.5)

## 2023-10-09 LAB — HEMOGLOBIN A1C: Hgb A1c MFr Bld: 5.9 % (ref 4.6–6.5)

## 2023-10-09 LAB — TSH: TSH: 1.86 u[IU]/mL (ref 0.35–5.50)

## 2023-10-14 ENCOUNTER — Encounter: Payer: Medicare Other | Admitting: Family Medicine

## 2023-10-16 ENCOUNTER — Ambulatory Visit (INDEPENDENT_AMBULATORY_CARE_PROVIDER_SITE_OTHER): Admitting: Family Medicine

## 2023-10-16 ENCOUNTER — Encounter: Payer: Self-pay | Admitting: Family Medicine

## 2023-10-16 VITALS — BP 136/82 | HR 86 | Temp 98.3°F | Ht 66.0 in | Wt 186.4 lb

## 2023-10-16 DIAGNOSIS — R7303 Prediabetes: Secondary | ICD-10-CM | POA: Diagnosis not present

## 2023-10-16 DIAGNOSIS — Z1211 Encounter for screening for malignant neoplasm of colon: Secondary | ICD-10-CM

## 2023-10-16 DIAGNOSIS — E7800 Pure hypercholesterolemia, unspecified: Secondary | ICD-10-CM

## 2023-10-16 DIAGNOSIS — Z0000 Encounter for general adult medical examination without abnormal findings: Secondary | ICD-10-CM

## 2023-10-16 MED ORDER — ALENDRONATE SODIUM 70 MG PO TABS
70.0000 mg | ORAL_TABLET | ORAL | 3 refills | Status: AC
Start: 2023-10-16 — End: ?

## 2023-10-16 NOTE — Assessment & Plan Note (Signed)
 Colonoscopy utd 06/2021 10 y recall if applicable

## 2023-10-16 NOTE — Progress Notes (Signed)
 Subjective:    Patient ID: Annette Hess, female    DOB: March 15, 1956, 68 y.o.   MRN: [id number]  HPI  Here for health maintenance exam and to review chronic medical problems   Wt Readings from Last 3 Encounters:  10/16/23 186 lb 6 oz (84.5 kg)  07/31/22 183 lb (83 kg)  06/27/21 189 lb (85.7 kg)   30.08 kg/m  Vitals:   10/16/23 0825  BP: 136/82  Pulse: 86  Temp: 98.3 F (36.8 C)  SpO2: 97%    Immunization History  Administered Date(s) Administered   Influenza,inj,Quad PF,6+ Mos 02/16/2016, 02/17/2017, 03/21/2018, 02/15/2019, 04/19/2020   Influenza-Unspecified 03/06/2015, 04/18/2021, 03/27/2022   Moderna Covid-19 Vaccine Bivalent Booster 77yrs & up 04/18/2021   PFIZER(Purple Top)SARS-COV-2 Vaccination 06/16/2019, 07/10/2019, 04/04/2020   PNEUMOCOCCAL CONJUGATE-20 07/31/2022   Td 05/27/1998   Tdap 01/10/2012, 12/24/2017   Unspecified SARS-COV-2 Vaccination 03/27/2022    There are no preventive care reminders to display for this patient.  Shingrix -on her list but had to re schedule   Mammogram 09/2023   Self breast exam - no lumps   Gyn health No problems    Colon cancer screening  06/2021 colonoscopy 10y recall   Bone health  Dexa  09/2022   osteoporosis  Falls - none  Fractures-none  Supplements ca and D    Exercise  Using weights Walking   No dental issues     Mood    10/16/2023    8:29 AM 07/31/2022    8:11 AM 05/14/2021    9:35 AM 04/19/2020   10:03 AM 04/19/2019    8:58 AM  Depression screen PHQ 2/9  Decreased Interest 0 0 1 0 0  Down, Depressed, Hopeless 0 0 1 0 1  PHQ - 2 Score 0 0 2 0 1  Altered sleeping 3  3 3 3   Tired, decreased energy 0  1 0 0  Change in appetite 0  2 2 2   Feeling bad or failure about yourself  0  1 0 0  Trouble concentrating 0  0 0 0  Moving slowly or fidgety/restless 0  0 0 0  Suicidal thoughts 0  0 0 0  PHQ-9 Score 3  9 5 6   Difficult doing work/chores Not difficult at all   Not difficult at all     Taking care of her husband with lymphoma (chemo/rad)  In remission but then some other problems  Is hard  Holding up ok overall      DM screening Lab Results  Component Value Date   HGBA1C 5.9 10/09/2023   HGBA1C 5.6 02/03/2015   Eating poorly with care giving  Is a challenge   Hyperlipidemia Lab Results  Component Value Date   CHOL 221 (H) 10/09/2023   CHOL 234 (H) 07/24/2022   CHOL 230 (H) 07/06/2021   Lab Results  Component Value Date   HDL 53.40 10/09/2023   HDL 67 07/24/2022   HDL 64 07/06/2021   Lab Results  Component Value Date   LDLCALC 147 (H) 10/09/2023   LDLCALC 153 (H) 07/24/2022   LDLCALC 152 (H) 07/06/2021   Lab Results  Component Value Date   TRIG 100.0 10/09/2023   TRIG 79 07/24/2022   TRIG 78 07/06/2021   Lab Results  Component Value Date   CHOLHDL 4 10/09/2023   CHOLHDL 3.5 07/24/2022   CHOLHDL 3.6 07/06/2021   No results found for: "LDLDIRECT"  The 10-year ASCVD risk score (Arnett DK, et al., 2019) is:  8.1%   Values used to calculate the score:     Age: 68 years     Sex: Female     Is Non-Hispanic African American: No     Diabetic: No     Tobacco smoker: No     Systolic Blood Pressure: 136 mmHg     Is BP treated: No     HDL Cholesterol: 53.4 mg/dL     Total Cholesterol: 221 mg/dL Pt declines treatment in past    Lab Results  Component Value Date   WBC 5.2 10/09/2023   HGB 15.0 10/09/2023   HCT 45.0 10/09/2023   MCV 93.3 10/09/2023   PLT 368.0 10/09/2023   Lab Results  Component Value Date   TSH 1.86 10/09/2023   Lab Results  Component Value Date   ALT 29 10/09/2023   AST 22 10/09/2023   ALKPHOS 107 10/09/2023   BILITOT 0.4 10/09/2023   Lab Results  Component Value Date   NA 141 10/09/2023   K 4.8 10/09/2023   CO2 27 10/09/2023   GLUCOSE 92 10/09/2023   BUN 18 10/09/2023   CREATININE 0.78 10/09/2023   CALCIUM 9.2 10/09/2023   GFR 78.82 10/09/2023   EGFR 82 07/24/2022   GFRNONAA 80 04/12/2020      Patient Active Problem List   Diagnosis Date Noted   Prediabetes 07/28/2023   Hyperlipidemia 02/17/2017   Alkaline phosphatase raised 02/08/2015   Routine general medical examination at a health care facility 01/10/2012   Colon cancer screening 01/10/2012   Past Medical History:  Diagnosis Date   Gestational diabetes    Past Surgical History:  Procedure Laterality Date   COLONOSCOPY WITH PROPOFOL  N/A 06/27/2021   Procedure: COLONOSCOPY WITH PROPOFOL ;  Surgeon: Selena Daily, MD;  Location: [facility] ENDOSCOPY;  Service: Gastroenterology;  Laterality: N/A;   LAPAROSCOPY     infertility   Social History   Tobacco Use   Smoking status: Never    Passive exposure: Past   Smokeless tobacco: Never  Vaping Use   Vaping status: Never Used  Substance Use Topics   Alcohol use: Yes    Alcohol/week: 1.0 standard drink of alcohol    Types: 1 Glasses of wine per week    Comment: wine-occ (once a week)   Drug use: No   Family History  Problem Relation Age of Onset   Cancer Mother        liver (deceased)   Diabetes Mother    Diabetes type II Mother    Heart defect Father        ablation   Cancer Maternal Grandmother        uterine   Cancer Paternal Grandmother        breast    Breast cancer Paternal Grandmother 57   Anorexia nervosa Daughter    No Known Allergies Current Outpatient Medications on File Prior to Visit  Medication Sig Dispense Refill   calcium carbonate (OSCAL) 1500 (600 Ca) MG TABS tablet Take by mouth daily with breakfast.     Multiple Vitamin (MULTIVITAMIN) tablet Take 1 tablet by mouth daily.     No current facility-administered medications on file prior to visit.    Review of Systems  Constitutional:  Negative for activity change, appetite change, fatigue, fever and unexpected weight change.  HENT:  Negative for congestion, ear pain, rhinorrhea, sinus pressure and sore throat.   Eyes:  Negative for pain, redness and visual disturbance.   Respiratory:  Negative for cough, shortness of breath  and wheezing.   Cardiovascular:  Negative for chest pain and palpitations.  Gastrointestinal:  Negative for abdominal pain, blood in stool, constipation and diarrhea.  Endocrine: Negative for polydipsia and polyuria.  Genitourinary:  Negative for dysuria, frequency and urgency.  Musculoskeletal:  Negative for arthralgias, back pain and myalgias.  Skin:  Negative for pallor and rash.  Allergic/Immunologic: Negative for environmental allergies.  Neurological:  Negative for dizziness, syncope and headaches.  Hematological:  Negative for adenopathy. Does not bruise/bleed easily.  Psychiatric/Behavioral:  Negative for decreased concentration and dysphoric mood. The patient is not nervous/anxious.        High stress       Objective:   Physical Exam Constitutional:      General: She is not in acute distress.    Appearance: Normal appearance. She is well-developed. She is obese. She is not ill-appearing or diaphoretic.  HENT:     Head: Normocephalic and atraumatic.     Right Ear: Tympanic membrane, ear canal and external ear normal.     Left Ear: Tympanic membrane, ear canal and external ear normal.     Nose: Nose normal. No congestion.     Mouth/Throat:     Mouth: Mucous membranes are moist.     Pharynx: Oropharynx is clear. No posterior oropharyngeal erythema.  Eyes:     General: No scleral icterus.    Extraocular Movements: Extraocular movements intact.     Conjunctiva/sclera: Conjunctivae normal.     Pupils: Pupils are equal, round, and reactive to light.  Neck:     Thyroid : No thyromegaly.     Vascular: No carotid bruit or JVD.  Cardiovascular:     Rate and Rhythm: Normal rate and regular rhythm.     Pulses: Normal pulses.     Heart sounds: Normal heart sounds.     No gallop.  Pulmonary:     Effort: Pulmonary effort is normal. No respiratory distress.     Breath sounds: Normal breath sounds. No wheezing.     Comments:  Good air exch Chest:     Chest wall: No tenderness.  Abdominal:     General: Bowel sounds are normal. There is no distension or abdominal bruit.     Palpations: Abdomen is soft. There is no mass.     Tenderness: There is no abdominal tenderness.     Hernia: No hernia is present.  Genitourinary:    Comments: Breast exam: No mass, nodules, thickening, tenderness, bulging, retraction, inflamation, nipple discharge or skin changes noted.  No axillary or clavicular LA.     Musculoskeletal:        General: No tenderness. Normal range of motion.     Cervical back: Normal range of motion and neck supple. No rigidity. No muscular tenderness.     Right lower leg: No edema.     Left lower leg: No edema.     Comments: No kyphosis   Lymphadenopathy:     Cervical: No cervical adenopathy.  Skin:    General: Skin is warm and dry.     Coloration: Skin is not pale.     Findings: No erythema or rash.     Comments: Solar lentigines diffusely   Neurological:     Mental Status: She is alert. Mental status is at baseline.     Cranial Nerves: No cranial nerve deficit.     Motor: No abnormal muscle tone.     Coordination: Coordination normal.     Gait: Gait normal.  Deep Tendon Reflexes: Reflexes are normal and symmetric.  Psychiatric:        Mood and Affect: Mood normal.        Cognition and Memory: Cognition and memory normal.           Assessment & Plan:   Problem List Items Addressed This Visit       Other   Routine general medical examination at a health care facility - Primary   Reviewed health habits including diet and exercise and skin cancer prevention Reviewed appropriate screening tests for age  Also reviewed health mt list, fam hx and immunization status , as well as social and family history   See HPI Labs reviewed and ordered Health Maintenance  Topic Date Due   Medicare Annual Wellness Visit  07/31/2023   Zoster (Shingles) Vaccine (1 of 2) 01/15/2025*   COVID-19  Vaccine (6 - 2024-25 season) 10/31/2025*   Flu Shot  12/26/2023   Mammogram  09/24/2024   DTaP/Tdap/Td vaccine (4 - Td or Tdap) 12/25/2027   Colon Cancer Screening  06/28/2031   Pneumonia Vaccine  Completed   DEXA scan (bone density measurement)  Completed   Hepatitis C Screening  Completed   HPV Vaccine  Aged Out   Meningitis B Vaccine  Aged Out   Stool Blood Test  Discontinued  *Topic was postponed. The date shown is not the original due date.    Plans ot get shingrix vaccine Discussed fall prevention, supplements and exercise for bone density  Plans to try alendronate PHQ 3 in setting of caregiver stress/declines need for help      Prediabetes   Lab Results  Component Value Date   HGBA1C 5.9 10/09/2023   HGBA1C 5.6 02/03/2015   disc imp of low glycemic diet and wt loss to prevent DM2       Hyperlipidemia   Disc goals for lipids and reasons to control them Rev last labs with pt Rev low sat fat diet in detail LDL 147 Pt declines medication now  ASCVD risk 8.1%  May be interested in cardiac ca scan in future Given handout and instructed to call if she wants an order       Colon cancer screening   Colonoscopy utd 06/2021 10 y recall if applicable

## 2023-10-16 NOTE — Assessment & Plan Note (Signed)
 Lab Results  Component Value Date   HGBA1C 5.9 10/09/2023   HGBA1C 5.6 02/03/2015   disc imp of low glycemic diet and wt loss to prevent DM2

## 2023-10-16 NOTE — Patient Instructions (Addendum)
 Get shingles vaccine at pharmacy when you can   Keep walking / stay active Add some strength training to your routine, this is important for bone and brain health and can reduce your risk of falls and help your body use insulin properly and regulate weight  Light weights, exercise bands , and internet videos are a good way to start  Yoga (chair or regular), machines , floor exercises or a gym with machines are also good options    Read about and then start alendronate If side effects- stop it and let us  know   For prediabetes Try to get most of your carbohydrates from produce (with the exception of white potatoes) and whole grains Eat less bread/pasta/rice/snack foods/cereals/sweets and other items from the middle of the grocery store (processed carbs)  For cholesterol Avoid red meat/ fried foods/ egg yolks/ fatty breakfast meats/ butter, cheese and high fat dairy/ and shellfish   If you are interested in a cardiac calcium scan -let us  know  Average cost of 200$

## 2023-10-16 NOTE — Assessment & Plan Note (Signed)
 Reviewed health habits including diet and exercise and skin cancer prevention Reviewed appropriate screening tests for age  Also reviewed health mt list, fam hx and immunization status , as well as social and family history   See HPI Labs reviewed and ordered Health Maintenance  Topic Date Due   Medicare Annual Wellness Visit  07/31/2023   Zoster (Shingles) Vaccine (1 of 2) 01/15/2025*   COVID-19 Vaccine (6 - 2024-25 season) 10/31/2025*   Flu Shot  12/26/2023   Mammogram  09/24/2024   DTaP/Tdap/Td vaccine (4 - Td or Tdap) 12/25/2027   Colon Cancer Screening  06/28/2031   Pneumonia Vaccine  Completed   DEXA scan (bone density measurement)  Completed   Hepatitis C Screening  Completed   HPV Vaccine  Aged Out   Meningitis B Vaccine  Aged Out   Stool Blood Test  Discontinued  *Topic was postponed. The date shown is not the original due date.    Plans ot get shingrix vaccine Discussed fall prevention, supplements and exercise for bone density  Plans to try alendronate PHQ 3 in setting of caregiver stress/declines need for help

## 2023-10-16 NOTE — Assessment & Plan Note (Signed)
 Disc goals for lipids and reasons to control them Rev last labs with pt Rev low sat fat diet in detail LDL 147 Pt declines medication now  ASCVD risk 8.1%  May be interested in cardiac ca scan in future Given handout and instructed to call if she wants an order

## 2023-11-19 DIAGNOSIS — K080 Exfoliation of teeth due to systemic causes: Secondary | ICD-10-CM | POA: Diagnosis not present

## 2023-11-21 DIAGNOSIS — K080 Exfoliation of teeth due to systemic causes: Secondary | ICD-10-CM | POA: Diagnosis not present

## 2023-12-04 ENCOUNTER — Telehealth: Payer: Self-pay

## 2023-12-04 NOTE — Telephone Encounter (Signed)
 Patient is due for shingles left message to call back. If she would like need to set up for vaccination clinic in office.

## 2024-01-29 DIAGNOSIS — K080 Exfoliation of teeth due to systemic causes: Secondary | ICD-10-CM | POA: Diagnosis not present

## 2024-05-03 DIAGNOSIS — H524 Presbyopia: Secondary | ICD-10-CM | POA: Diagnosis not present

## 2024-10-11 ENCOUNTER — Other Ambulatory Visit

## 2024-10-19 ENCOUNTER — Encounter: Admitting: Family Medicine
